# Patient Record
Sex: Female | Born: 1995 | Race: White | Hispanic: No | Smoking: Former smoker
Health system: Southern US, Community
[De-identification: ages and names within clinical notes are randomized; demographics above are authoritative.]

## PROBLEM LIST (undated history)

## (undated) DIAGNOSIS — R350 Frequency of micturition: Secondary | ICD-10-CM

## (undated) HISTORY — DX: Frequency of micturition: R35.0

## (undated) HISTORY — PX: WISDOM TOOTH EXTRACTION: SHX21

---
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
## 2013-01-04 ENCOUNTER — Other Ambulatory Visit: Payer: Self-pay | Admitting: Pediatrics

## 2013-04-02 ENCOUNTER — Other Ambulatory Visit: Payer: Self-pay | Admitting: Pediatrics

## 2013-04-07 ENCOUNTER — Telehealth: Payer: Self-pay | Admitting: *Deleted

## 2013-04-07 NOTE — Telephone Encounter (Signed)
Pt called and stated hat she had contacted pharmacy concerning a refill on her medication and that we had refused it stating that she needed an appointment. After chart review noted pt has not been seen since 12/08/2011 and that she would have to be seen by a MD before any refills could be given. Pt stated that she has already started seeing another MD but can not get in to see them before March and wanted to know if she would still have to be seen before a refill was given. Advised pt that if she has transferred care to another PCP then she would need to fill out a transfer of medical records before we could make her an appointment where then any medications would be discussed with MD.

## 2013-04-12 ENCOUNTER — Ambulatory Visit: Payer: BC Managed Care – PPO | Admitting: Family Medicine

## 2013-04-13 ENCOUNTER — Ambulatory Visit: Payer: BC Managed Care – PPO | Admitting: Family Medicine

## 2013-04-14 ENCOUNTER — Encounter: Payer: Self-pay | Admitting: Family Medicine

## 2013-04-14 ENCOUNTER — Ambulatory Visit (INDEPENDENT_AMBULATORY_CARE_PROVIDER_SITE_OTHER): Payer: BC Managed Care – PPO | Admitting: Family Medicine

## 2013-04-14 VITALS — BP 112/76 | HR 83 | Temp 98.8°F | Resp 16 | Ht 61.5 in | Wt 151.8 lb

## 2013-04-14 DIAGNOSIS — Z789 Other specified health status: Secondary | ICD-10-CM

## 2013-04-14 DIAGNOSIS — R3915 Urgency of urination: Secondary | ICD-10-CM

## 2013-04-14 DIAGNOSIS — Z3009 Encounter for other general counseling and advice on contraception: Secondary | ICD-10-CM

## 2013-04-14 DIAGNOSIS — IMO0001 Reserved for inherently not codable concepts without codable children: Secondary | ICD-10-CM

## 2013-04-14 DIAGNOSIS — F4010 Social phobia, unspecified: Secondary | ICD-10-CM

## 2013-04-14 DIAGNOSIS — R350 Frequency of micturition: Secondary | ICD-10-CM

## 2013-04-14 LAB — POCT URINE PREGNANCY: Preg Test, Ur: NEGATIVE

## 2013-04-14 MED ORDER — SERTRALINE HCL 25 MG PO TABS: 25.0000 mg | ORAL_TABLET | Freq: Every day | ORAL | Status: DC

## 2013-04-14 MED ORDER — NORGESTIM-ETH ESTRAD TRIPHASIC 0.18/0.215/0.25 MG-25 MCG PO TABS: ORAL_TABLET | ORAL | Status: DC

## 2013-04-14 NOTE — Patient Instructions (Signed)
Ethinyl Estradiol; Norgestimate tablets What is this medicine? ETHINYL ESTRADIOL; NORGESTIMATE (ETH in il es tra DYE ole; nor JES ti mate) is an oral contraceptive. The products combine two types of female hormones, an estrogen and a progestin. They are used to prevent ovulation and pregnancy. Some products are also used to treat acne in females. This medicine may be used for other purposes; ask your health care provider or pharmacist if you have questions. COMMON BRAND NAME(S): Estarylla, MONO-LINYAH, MonoNessa, Ortho Tri-Cyclen Lo, Ortho Tri-Cyclen, Ortho-Cyclen, Previfem , Sprintec, Tri-Estarylla, TRI-LINYAH, Tri-Lo-Sprintec , Tri-Previfem , Tri-Sprintec , Trinessa What should I tell my health care provider before I take this medicine? They need to know if you have or ever had any of these conditions: -abnormal vaginal bleeding -blood vessel disease or blood clots -breast, cervical, endometrial, ovarian, liver, or uterine cancer -diabetes -gallbladder disease -heart disease or recent heart attack -high blood pressure -high cholesterol -kidney disease -liver disease -migraine headaches -stroke -systemic lupus erythematosus (SLE) -tobacco smoker -an unusual or allergic reaction to estrogens, progestins, other medicines, foods, dyes, or preservatives -pregnant or trying to get pregnant -breast-feeding How should I use this medicine? Take this medicine by mouth. To reduce nausea, this medicine may be taken with food. Follow the directions on the prescription label. Take this medicine at the same time each day and in the order directed on the package. Do not take your medicine more often than directed. Contact your pediatrician regarding the use of this medicine in children. Special care may be needed. This medicine has been used in female children who have started having menstrual periods. A patient package insert for the product will be given with each prescription and refill. Read this  sheet carefully each time. The sheet may change frequently. Overdosage: If you think you have taken too much of this medicine contact a poison control center or emergency room at once. NOTE: This medicine is only for you. Do not share this medicine with others. What if I miss a dose? If you miss a dose, refer to the patient information sheet you received with your medicine for direction. If you miss more than one pill, this medicine may not be as effective and you may need to use another form of birth control. What may interact with this medicine? -acetaminophen -antibiotics or medicines for infections, especially rifampin, rifabutin, rifapentine, and griseofulvin, and possibly penicillins or tetracyclines -aprepitant -ascorbic acid (vitamin C) -atorvastatin -barbiturate medicines, such as phenobarbital -bosentan -carbamazepine -caffeine -clofibrate -cyclosporine -dantrolene -doxercalciferol -felbamate -grapefruit juice -hydrocortisone -medicines for anxiety or sleeping problems, such as diazepam or temazepam -medicines for diabetes, including pioglitazone -mineral oil -modafinil -mycophenolate -nefazodone -oxcarbazepine -phenytoin -prednisolone -ritonavir or other medicines for HIV infection or AIDS -rosuvastatin -selegiline -soy isoflavones supplements -St. John's wort -tamoxifen or raloxifene -theophylline -thyroid hormones -topiramate -warfarin This list may not describe all possible interactions. Give your health care provider a list of all the medicines, herbs, non-prescription drugs, or dietary supplements you use. Also tell them if you smoke, drink alcohol, or use illegal drugs. Some items may interact with your medicine. What should I watch for while using this medicine? Visit your doctor or health care professional for regular checks on your progress. You will need a regular breast and pelvic exam and Pap smear while on this medicine. You should also discuss the  need for regular mammograms with your health care professional, and follow his or her guidelines for these tests. This medicine can make your body retain fluid, making your   fingers, hands, or ankles swell. Your blood pressure can go up. Contact your doctor or health care professional if you feel you are retaining fluid. Use an additional method of contraception during the first cycle that you take these tablets. If you have any reason to think you are pregnant, stop taking this medicine right away and contact your doctor or health care professional. If you are taking this medicine for hormone related problems, it may take several cycles of use to see improvement in your condition. Smoking increases the risk of getting a blood clot or having a stroke while you are taking birth control pills, especially if you are more than 20 years old. You are strongly advised not to smoke. This medicine can make you more sensitive to the sun. Keep out of the sun. If you cannot avoid being in the sun, wear protective clothing and use sunscreen. Do not use sun lamps or tanning beds/booths. If you wear contact lenses and notice visual changes, or if the lenses begin to feel uncomfortable, consult your eye care specialist. In some women, tenderness, swelling, or minor bleeding of the gums may occur. Notify your dentist if this happens. Brushing and flossing your teeth regularly may help limit this. See your dentist regularly and inform your dentist of the medicines you are taking. If you are going to have elective surgery, you may need to stop taking this medicine before the surgery. Consult your health care professional for advice. This medicine does not protect you against HIV infection (AIDS) or any other sexually transmitted diseases. What side effects may I notice from receiving this medicine? Side effects that you should report to your doctor or health care professional as soon as possible: -breast tissue changes or  discharge -changes in vaginal bleeding during your period or between your periods -chest pain -coughing up blood -dizziness or fainting spells -headaches or migraines -leg, arm or groin pain -severe or sudden headaches -stomach pain (severe) -sudden shortness of breath -sudden loss of coordination, especially on one side of the body -speech problems -symptoms of vaginal infection like itching, irritation or unusual discharge -tenderness in the upper abdomen -vomiting -weakness or numbness in the arms or legs, especially on one side of the body -yellowing of the eyes or skin Side effects that usually do not require medical attention (report to your doctor or health care professional if they continue or are bothersome): -breakthrough bleeding and spotting that continues beyond the 3 initial cycles of pills -breast tenderness -mood changes, anxiety, depression, frustration, anger, or emotional outbursts -increased sensitivity to sun or ultraviolet light -nausea -skin rash, acne, or brown spots on the skin -weight gain (slight) This list may not describe all possible side effects. Call your doctor for medical advice about side effects. You may report side effects to FDA at 1-800-FDA-1088. Where should I keep my medicine? Keep out of the reach of children. Store at room temperature between 15 and 30 degrees C (59 and 86 degrees F). Throw away any unused medicine after the expiration date. NOTE: This sheet is a summary. It may not cover all possible information. If you have questions about this medicine, talk to your doctor, pharmacist, or health care provider.  2014, [employer]vier/Gold Standard. (2008-01-28 13:40:47) Sertraline tablets What is this medicine? SERTRALINE (SER tra leen) is used to treat depression. It may also be used to treat obsessive compulsive disorder, panic disorder, post-trauma stress, premenstrual dysphoric disorder (PMDD) or social anxiety. This medicine may be used  for other purposes;  ask your health care provider or pharmacist if you have questions. COMMON BRAND NAME(S): Zoloft What should I tell my health care provider before I take this medicine? They need to know if you have any of these conditions: -bipolar disorder or a family history of bipolar disorder -diabetes -glaucoma -heart disease -high blood pressure -history of irregular heartbeat -history of low levels of calcium, magnesium, or potassium in the blood -if you often drink alcohol -liver disease -receiving electroconvulsive therapy -seizures -suicidal thoughts, plans, or attempt; a previous suicide attempt by you or a family member -thyroid disease -an unusual or allergic reaction to sertraline, other medicines, foods, dyes, or preservatives -pregnant or trying to get pregnant -breast-feeding How should I use this medicine? Take this medicine by mouth with a glass of water. Follow the directions on the prescription label. You can take it with or without food. Take your medicine at regular intervals. Do not take your medicine more often than directed. Do not stop taking this medicine suddenly except upon the advice of your doctor. Stopping this medicine too quickly may cause serious side effects or your condition may worsen. A special MedGuide will be given to you by the pharmacist with each prescription and refill. Be sure to read this information carefully each time. Talk to your pediatrician regarding the use of this medicine in children. While this drug may be prescribed for children as young as 7 years for selected conditions, precautions do apply. Overdosage: If you think you have taken too much of this medicine contact a poison control center or emergency room at once. NOTE: This medicine is only for you. Do not share this medicine with others. What if I miss a dose? If you miss a dose, take it as soon as you can. If it is almost time for your next dose, take only that dose. Do  not take double or extra doses. What may interact with this medicine? Do not take this medicine with any of the following medications: -certain medicines for fungal infections like fluconazole, itraconazole, ketoconazole, posaconazole, voriconazole -cisapride -disulfiram -dofetilide -linezolid -MAOIs like Carbex, Eldepryl, Marplan, Nardil, and Parnate -metronidazole -methylene blue (injected into a vein) -pimozide -thioridazine -ziprasidone  This medicine may also interact with the following medications: -alcohol -aspirin and aspirin-like medicines -certain medicines for depression, anxiety, or psychotic disturbances -certain medicines for irregular heart beat like flecainide, propafenone -certain medicines for migraine headaches like almotriptan, eletriptan, frovatriptan, naratriptan, rizatriptan, sumatriptan, zolmitriptan -certain medicines for sleep -certain medicines for seizures like carbamazepine, valproic acid, phenytoin -certain medicines that treat or prevent blood clots like warfarin, enoxaparin, dalteparin -cimetidine -digoxin -diuretics -fentanyl -furazolidone -isoniazid -lithium -NSAIDs, medicines for pain and inflammation, like ibuprofen or naproxen -other medicines that prolong the QT interval (cause an abnormal heart rhythm) -procarbazine -rasagiline -supplements like St. John's wort, kava kava, valerian -tolbutamide -tramadol -tryptophan This list may not describe all possible interactions. Give your health care provider a list of all the medicines, herbs, non-prescription drugs, or dietary supplements you use. Also tell them if you smoke, drink alcohol, or use illegal drugs. Some items may interact with your medicine. What should I watch for while using this medicine? Tell your doctor if your symptoms do not get better or if they get worse. Visit your doctor or health care professional for regular checks on your progress. Because it may take several weeks to  see the full effects of this medicine, it is important to continue your treatment as prescribed by your doctor. Patients and their  families should watch out for new or worsening thoughts of suicide or depression. Also watch out for sudden changes in feelings such as feeling anxious, agitated, panicky, irritable, hostile, aggressive, impulsive, severely restless, overly excited and hyperactive, or not being able to sleep. If this happens, especially at the beginning of treatment or after a change in dose, call your health care professional. Dennis Bast may get drowsy or dizzy. Do not drive, use machinery, or do anything that needs mental alertness until you know how this medicine affects you. Do not stand or sit up quickly, especially if you are an older patient. This reduces the risk of dizzy or fainting spells. Alcohol may interfere with the effect of this medicine. Avoid alcoholic drinks. Your mouth may get dry. Chewing sugarless gum or sucking hard candy, and drinking plenty of water may help. Contact your doctor if the problem does not go away or is severe. What side effects may I notice from receiving this medicine? Side effects that you should report to your doctor or health care professional as soon as possible: -allergic reactions like skin rash, itching or hives, swelling of the face, lips, or tongue -black or bloody stools, blood in the urine or vomit -fast, irregular heartbeat -feeling faint or lightheaded, falls -hallucination, loss of contact with reality -seizures -suicidal thoughts or other mood changes -unusual bleeding or bruising -unusually weak or tired -vomiting Side effects that usually do not require medical attention (report to your doctor or health care professional if they continue or are bothersome): -change in appetite -change in sex drive or performance -diarrhea -increased sweating -indigestion, nausea -tremors This list may not describe all possible side effects. Call  your doctor for medical advice about side effects. You may report side effects to FDA at 1-800-FDA-1088. Where should I keep my medicine? Keep out of the reach of children. Store at room temperature between 15 and 30 degrees C (59 and 86 degrees F). Throw away any unused medicine after the expiration date. NOTE: This sheet is a summary. It may not cover all possible information. If you have questions about this medicine, talk to your doctor, pharmacist, or health care provider.  2014, [employer] Standard. (2012-09-08 12:57:35) Social Anxiety Disorder Social anxiety disorder, previously called social phobia, is a mental disorder. People with social anxiety disorder frequently feel nervous, afraid, or embarrassed when around other people in social situations. They constantly worry that other people are judging or criticizing them for how they look, what they say, or how they act. They may worry that other people might reject them because of their appearance or behavior. Social anxiety disorder is more than just occasional shyness or self-consciousness. It can cause severe emotional distress. It can interfere with daily life activities. Social anxiety disorder also may lead to excessive alcohol or drug use and even suicide.  Social anxiety disorder is actually one of the most common mental disorders. It can develop at any time but usually starts in the teenage years. Women are more commonly affected than men. Social anxiety disorder is also more common in people who have family members with anxiety disorders. It also is more common in people who have physical deformities or conditions with characteristics that are obvious to others, such as stuttered speech or movement abnormalities (Parkinson disease).  SYMPTOMS  In addition to feeling anxious or fearful in social situations, people with social anxiety disorder frequently have physical symptoms. Examples include:  Red face (blushing).  Racing  heart.  Sweating.  Shaky hands or  voice.  Confusion.  Lightheadedness.  Upset stomach and diarrhea. DIAGNOSIS  Social anxiety disorder is diagnosed through an assessment by your caregiver. Your caregiver will ask you questions about your mood, thoughts, and reactions in social situations. Your caregiver may ask you about your medical history and use of alcohol or drugs, including prescription medications. Certain medical conditions and the use of certain substances, including caffeine, can cause symptoms similar to social anxiety disorder. Your caregiver may refer you to a mental health specialist for further evaluation or treatment. The criteria for diagnosis of social anxiety disorder are:  Marked fear or anxiety in one or more social situations in which you may be closely watched or studied by others. Examples of such situations include:  Interacting socially (having a conversation with others, going to a party, or meeting strangers).  Being observed (eating or drinking in public or being called on in class).  Performing in front of others (giving a speech).  The social situations of concern almost always cause fear or anxiety, not just occasionally.  People with social anxiety disorder fear that they will be viewed negatively in a way that will be embarrassing, will lead to rejection, or will offend others. This fear is out of proportion to the actual threat posed by the social situation.  Often the triggering social situations are avoided, or they are endured with intense fear or anxiety. The fear, anxiety, or avoidance is persistent and lasts for 6 months or longer.  The anxiety causes difficulty functioning in at least some parts of your daily life. TREATMENT  Several types of treatment are available for social anxiety disorder. These treatments are often used in combination and include:   Talk therapy. Group talk therapy allows you to see that you are not alone with these  problems. Individual talk therapy helps you address your specific anxiety issues with a caring professional. The most effective forms of talk therapy for social anxiety disorder are cognitive behavioral therapy and exposure therapy. Cognitive behavioral therapy helps you to identify and change negative thoughts and beliefs that are at the root of the disorder. Exposure therapy allows you to gradually face the situations that you fear most.  Relaxation and coping techniques. These include deep breathing, self-talk, meditation, visual imagery, and yoga. Relaxation techniques help to keep you calm in social situations.  Social [employer] secretary.Social skills can be learned on your own or with the help of a talk therapist. They can help you feel more confident and comfortable in social situations.  Medication. For anxiety limited to performance situations (performance anxiety), medication called beta blockers can help by reducing or preventing the physical symptoms of social anxiety disorder. For more persistent and generalized social anxiety, antidepressant medication may be prescribed to help control symptoms. In severe cases of social anxiety disorder, strong antianxiety medication, called benzodiazepines, may be prescribed on a limited basis and for a short time. Document Released: 01/10/2005 Document Revised: 06/08/2012 Document Reviewed: 05/12/2012 [facility] Patient Information 2014 [place], [place].

## 2013-04-15 DIAGNOSIS — F4010 Social phobia, unspecified: Secondary | ICD-10-CM | POA: Insufficient documentation

## 2013-04-15 DIAGNOSIS — Z3009 Encounter for other general counseling and advice on contraception: Secondary | ICD-10-CM | POA: Insufficient documentation

## 2013-04-15 DIAGNOSIS — IMO0001 Reserved for inherently not codable concepts without codable children: Secondary | ICD-10-CM | POA: Insufficient documentation

## 2013-04-15 DIAGNOSIS — R3915 Urgency of urination: Secondary | ICD-10-CM | POA: Insufficient documentation

## 2013-04-15 NOTE — Progress Notes (Signed)
Subjective:     Patient ID: Yesenia Macias, female   DOB: 1995-10-21, 18 y.o.   MRN: [id number]  HPI Comments: Yesenia Macias is a 18 y.o WF here with her mother.  Yesenia Macias is here for medication refill. Yesenia Macias has been on Oxybutynin for years. The mother adds to the history today by stating that the patient has overactive bladder and has been given this medicine to control her bladder.  Yesenia Macias has been out of this medicine for some weeks and has been taking Valerian root instead.  Yesenia Macias says Yesenia Macias has read that this can help with the bladder as well. They explain her symptoms and Yesenia Macias states Yesenia Macias can always tell when her bladder will become overactive. Yesenia Macias says it's normally during stressful or anxious situations. Mostly when Yesenia Macias is meeting new people or going places, even familiar places such as to the mall or grocery store. The mother is here because Yesenia Macias had to check Yesenia Macias in because Yesenia Macias was nervous even going up to the check in counter today at the office. Yesenia Macias says it's the 'unknown' that makes her feel nervous. Her hands also may get sweaty and tremor.  Yesenia Macias feels the urge to urinate and most times, Yesenia Macias doesn't really have to go and not much urine will come out.  Yesenia Macias says Yesenia Macias thinks about urinating during stressful situations and the thought of this, will usually allow her to urinate. Yesenia Macias says this has been going on for many years and they both can recall a stressful event that occurred that triggered this and just continued during the years. Yesenia Macias also says Yesenia Macias lives by herself and Yesenia Macias feels anxious when Yesenia Macias goes outside to walk her dog outside.  Yesenia Macias also has overactive bladder during these times as well. The mother also reports a work up in the past when Yesenia Macias was younger and the ultrasound was normal.  Yesenia Macias denies any dysuria, urinary hesitancy, flank pain, vaginal discharge or vaginal pain. Yesenia Macias is sexually active and doesn't use barrier methods all the time. Yesenia Macias use to be on OCPs in the past but haven't been in the last  several months. Her LMP was 2 weeks ago.   PMH: just mentioned in HPI Medications: valerian root Allergies: NKDA Surgeries: None Social: lives by herself and works at [employer]. Yesenia Macias denies any tobacco, alcohol, or illicit drug use.     Review of Systems  Constitutional: Negative for fever, chills, activity change, appetite change and fatigue.  HENT: Negative for congestion and voice change.   Eyes: Negative for photophobia and visual disturbance.  Respiratory: Negative for shortness of breath and wheezing.   Cardiovascular: Negative for chest pain and palpitations.  Gastrointestinal: Negative for nausea, vomiting, abdominal pain, diarrhea and constipation.  Endocrine: Negative for cold intolerance, heat intolerance, polydipsia and polyuria.  Genitourinary: Positive for urgency and frequency. Negative for dysuria, hematuria, flank pain, decreased urine volume, vaginal bleeding, vaginal discharge, enuresis, difficulty urinating, genital sores, vaginal pain, menstrual problem, pelvic pain and dyspareunia.  Musculoskeletal: Negative for back pain.  Skin: Negative for color change and pallor.  Neurological: Negative for dizziness, seizures, weakness, numbness and headaches.  Psychiatric/Behavioral: Negative for suicidal ideas, hallucinations, behavioral problems, confusion, sleep disturbance, self-injury, decreased concentration and agitation. The patient is nervous/anxious. The patient is not hyperactive.        Objective:   Physical Exam  Nursing note and vitals reviewed. Constitutional: Yesenia Macias is oriented to person, place, and time. Yesenia Macias appears well-developed and well-nourished.  HENT:  Head: Normocephalic and atraumatic.  Right Ear: External ear normal.  Left Ear: External ear normal.  Nose: Nose normal.  Eyes: Conjunctivae and EOM are normal.  Neck: Normal range of motion. Neck supple.  Cardiovascular: Normal rate, regular rhythm and normal heart sounds.   Pulmonary/Chest: Effort  normal and breath sounds normal.  Abdominal: Soft. Bowel sounds are normal.  Musculoskeletal: Normal range of motion.  Neurological: Yesenia Macias is alert and oriented to person, place, and time. Yesenia Macias has normal reflexes.  Skin: Skin is warm and dry.  Psychiatric: Yesenia Macias has a normal mood and affect. Her behavior is normal. Judgment and thought content normal.   Urine pregnancy- negative    Assessment:     Yesenia Macias was seen today for medication refill.  Diagnoses and associated orders for this visit:  Urinary urgency  Urinary frequency  Social anxiety disorder - sertraline (ZOLOFT) 25 MG tablet; Take 1 tablet (25 mg total) by mouth daily.  Sexually active - POCT urine pregnancy - Norgestimate-Ethinyl Estradiol Triphasic 0.18/0.215/0.25 MG-25 MCG tab; Take one tab by mouth daily To start the Sunday after your period  Family planning - POCT urine pregnancy - Norgestimate-Ethinyl Estradiol Triphasic 0.18/0.215/0.25 MG-25 MCG tab; Take one tab by mouth daily To start the Sunday after your period       Plan:     Urinary urgency and frequency is always predictable and is likely due to social anxiety/phobia.  Will start a trial of zoloft and see if this helps. Will discontinue the use of oxybutynin as this isn't indicated for this situation and what I suspect the cause of her urinary symptoms is from. Yesenia Macias also can recall a life-changing/stressful event that occurred and triggered these symptoms which coincides more with social anxiety d/o and not overactive bladder. I doubt Yesenia Macias has structural abnormalities of her overactive bladder. They are in agreement when stopping the oxybuytnin and trying the zoloft. I have also counseled her on safe sex practices and due to her sexual activity and no use of barrier methods, Yesenia Macias is wanting OCPs today. Urine pregnancy obtained today and rx given to her for OCPs. Have discussed the possible side effects of both medications and given them handouts on both medicines.    Will see back in 4 weeks for follow up. Have also advised her to take the Zoloft daily in order to get the most benefit from the medicine. Yesenia Macias was also instructed to stop the Valerian root.

## 2013-05-12 ENCOUNTER — Ambulatory Visit (INDEPENDENT_AMBULATORY_CARE_PROVIDER_SITE_OTHER): Payer: BC Managed Care – PPO | Admitting: Family Medicine

## 2013-05-12 ENCOUNTER — Encounter: Payer: Self-pay | Admitting: Family Medicine

## 2013-05-12 VITALS — BP 110/68 | HR 72 | Temp 97.1°F | Resp 18 | Ht 60.25 in | Wt 152.0 lb

## 2013-05-12 DIAGNOSIS — F4010 Social phobia, unspecified: Secondary | ICD-10-CM

## 2013-05-12 DIAGNOSIS — R32 Unspecified urinary incontinence: Secondary | ICD-10-CM

## 2013-05-12 LAB — POCT URINALYSIS DIPSTICK
BILIRUBIN UA: NEGATIVE
GLUCOSE UA: NEGATIVE
KETONES UA: NEGATIVE
Leukocytes, UA: NEGATIVE
NITRITE UA: NEGATIVE
PH UA: 6
Protein, UA: NEGATIVE
RBC UA: NEGATIVE
Urobilinogen, UA: 0.2

## 2013-05-12 MED ORDER — SERTRALINE HCL 50 MG PO TABS: 25.0000 mg | ORAL_TABLET | Freq: Every day | ORAL | Status: DC

## 2013-05-12 NOTE — Patient Instructions (Signed)
Warts - Try Dr. Zoe Lan freeze away kit (blue) Warts are a common viral infection. They are most commonly caused by the human papillomavirus (HPV). Warts can occur at all ages. However, they occur most frequently in older children and infrequently in the elderly. Warts may be single or multiple. Location and size varies. Warts can be spread by scratching the wart and then scratching normal skin. The life cycle of warts varies. However, most will disappear over many months to a couple years. Warts commonly do not cause problems (asymptomatic) unless they are over an area of pressure, such as the bottom of the foot. If they are large enough, they may cause pain with walking. DIAGNOSIS  Warts are most commonly diagnosed by their appearance. Tissue samples (biopsies) are not required unless the wart looks abnormal. Most warts have a rough surface, are round, oval, or irregular, and are skin-colored to light yellow, brown, or gray. They are generally less than  inch (1.3 cm), but they can be any size. TREATMENT   Observation or no treatment.  Freezing with liquid nitrogen.  High heat (cautery).  Boosting the body's immunity to fight off the wart (immunotherapy using Candida antigen).  Laser surgery.  Application of various irritants and solutions. HOME CARE INSTRUCTIONS  Follow your caregiver's instructions. No special precautions are necessary. Often, treatment may be followed by a return (recurrence) of warts. Warts are generally difficult to treat and get rid of. If treatment is done in a clinic setting, usually more than 1 treatment is required. This is usually done on only a monthly basis until the wart is completely gone. SEEK IMMEDIATE MEDICAL CARE IF: The treated skin becomes red, puffy (swollen), or painful. Document Released: 11/21/2004 Document Revised: 06/08/2012 Document Reviewed: 05/19/2009 [facility] Patient Information 2014 [place].

## 2013-05-12 NOTE — Progress Notes (Signed)
   Subjective:    Patient ID: Yesenia Macias, female    DOB: [birth year], 18 y.o.   MRN: [id number]  HPI Pt here with continued incontinance of urine. Prior notes reviewed. She had incontinance as a child, but then it wprsened again within the [past few months. She says it is worse when she has social interactions such as walking into a room of people although she doesn't feel terribly anxious. No dysuria. She does have urgency. No pain.    Review of Systems A 12 point review of systems is negative except as per hpi.       Objective:   Physical Exam Nursing note and vitals reviewed. Constitutional: She is oriented to person, place, and time. She appears well-developed and well-nourished.  HENT:  Cardiovascular: Normal rate, regular rhythm and normal heart sounds.   Pulmonary/Chest: Effort normal and breath sounds normal.  Abdominal: Soft. Bowel sounds are normal. She exhibits no distension. There is mild spt. There is no rebound.  Lymphadenopathy:    She has no cervical adenopathy.  Neurological: She is alert and oriented to person, place, and time. She has normal reflexes.  Skin: Skin is warm and dry.  Psychiatric: She has a normal mood and affect. Her behavior is normal.         Assessment & Plan:  Josiah was seen today for follow-up.  Diagnoses and associated orders for this visit:  Social anxiety disorder - sertraline (ZOLOFT) 50 MG tablet; Take 0.5 tablets (25 mg total) by mouth daily.  Incontinence of urine in female - Ambulatory referral to Urology - hold until ucx results return - POCT urinalysis dipstick - Urine culture

## 2013-05-13 ENCOUNTER — Other Ambulatory Visit: Payer: Self-pay | Admitting: Family Medicine

## 2013-05-15 LAB — URINE CULTURE

## 2013-05-17 ENCOUNTER — Other Ambulatory Visit: Payer: Self-pay | Admitting: Family Medicine

## 2013-05-17 ENCOUNTER — Telehealth: Payer: Self-pay | Admitting: *Deleted

## 2013-05-17 DIAGNOSIS — N390 Urinary tract infection, site not specified: Secondary | ICD-10-CM

## 2013-05-17 MED ORDER — NITROFURANTOIN MONOHYD MACRO 100 MG PO CAPS: 100.0000 mg | ORAL_CAPSULE | Freq: Two times a day (BID) | ORAL | Status: DC

## 2013-05-17 NOTE — Telephone Encounter (Signed)
Message copied by [facility], Louis Meckel on Mon May 17, 2013  1:22 PM ------      Message from: Doran Heater      Created: Mon May 17, 2013  1:08 PM       Please let pt know that her ucx was positive. Im sending her in abx. Hold off on uro referral until after abx - this may resolve sx. If not then go ahead with referral. Thanks AW ------

## 2013-05-17 NOTE — Telephone Encounter (Signed)
Pt called and notified

## 2013-05-17 NOTE — Progress Notes (Unsigned)
macrobid for uti. Please have pt f/u for toc in 2 weeks. Thanks AW

## 2013-05-17 NOTE — Progress Notes (Signed)
Pt notified and appreciative.

## 2013-06-23 ENCOUNTER — Encounter: Payer: Self-pay | Admitting: Family Medicine

## 2013-06-23 ENCOUNTER — Ambulatory Visit (INDEPENDENT_AMBULATORY_CARE_PROVIDER_SITE_OTHER): Payer: BC Managed Care – PPO | Admitting: Family Medicine

## 2013-06-23 VITALS — BP 112/70 | HR 74 | Temp 97.6°F | Resp 18 | Ht 61.0 in | Wt 152.8 lb

## 2013-06-23 DIAGNOSIS — F4010 Social phobia, unspecified: Secondary | ICD-10-CM

## 2013-06-23 MED ORDER — SERTRALINE HCL 100 MG PO TABS: 100.0000 mg | ORAL_TABLET | Freq: Every day | ORAL | Status: DC

## 2013-06-23 NOTE — Patient Instructions (Signed)
Social Anxiety Disorder Social anxiety disorder, previously called social phobia, is a mental disorder. People with social anxiety disorder frequently feel nervous, afraid, or embarrassed when around other people in social situations. They constantly worry that other people are judging or criticizing them for how they look, what they say, or how they act. They may worry that other people might reject them because of their appearance or behavior. Social anxiety disorder is more than just occasional shyness or self-consciousness. It can cause severe emotional distress. It can interfere with daily life activities. Social anxiety disorder also may lead to excessive alcohol or drug use and even suicide.  Social anxiety disorder is actually one of the most common mental disorders. It can develop at any time but usually starts in the teenage years. Women are more commonly affected than men. Social anxiety disorder is also more common in people who have family members with anxiety disorders. It also is more common in people who have physical deformities or conditions with characteristics that are obvious to others, such as stuttered speech or movement abnormalities (Parkinson disease).  SYMPTOMS  In addition to feeling anxious or fearful in social situations, people with social anxiety disorder frequently have physical symptoms. Examples include:  Red face (blushing).  Racing heart.  Sweating.  Shaky hands or voice.  Confusion.  Lightheadedness.  Upset stomach and diarrhea. DIAGNOSIS  Social anxiety disorder is diagnosed through an assessment by your caregiver. Your caregiver will ask you questions about your mood, thoughts, and reactions in social situations. Your caregiver may ask you about your medical history and use of alcohol or drugs, including prescription medications. Certain medical conditions and the use of certain substances, including caffeine, can cause symptoms similar to social anxiety  disorder. Your caregiver may refer you to a mental health specialist for further evaluation or treatment. The criteria for diagnosis of social anxiety disorder are:  Marked fear or anxiety in one or more social situations in which you may be closely watched or studied by others. Examples of such situations include:  Interacting socially (having a conversation with others, going to a party, or meeting strangers).  Being observed (eating or drinking in public or being called on in class).  Performing in front of others (giving a speech).  The social situations of concern almost always cause fear or anxiety, not just occasionally.  People with social anxiety disorder fear that they will be viewed negatively in a way that will be embarrassing, will lead to rejection, or will offend others. This fear is out of proportion to the actual threat posed by the social situation.  Often the triggering social situations are avoided, or they are endured with intense fear or anxiety. The fear, anxiety, or avoidance is persistent and lasts for 6 months or longer.  The anxiety causes difficulty functioning in at least some parts of your daily life. TREATMENT  Several types of treatment are available for social anxiety disorder. These treatments are often used in combination and include:   Talk therapy. Group talk therapy allows you to see that you are not alone with these problems. Individual talk therapy helps you address your specific anxiety issues with a caring professional. The most effective forms of talk therapy for social anxiety disorder are cognitive behavioral therapy and exposure therapy. Cognitive behavioral therapy helps you to identify and change negative thoughts and beliefs that are at the root of the disorder. Exposure therapy allows you to gradually face the situations that you fear most.  Relaxation  and coping techniques. These include deep breathing, self-talk, meditation, visual imagery,  and yoga. Relaxation techniques help to keep you calm in social situations.  Social Company secretary.Social skills can be learned on your own or with the help of a talk therapist. They can help you feel more confident and comfortable in social situations.  Medication. For anxiety limited to performance situations (performance anxiety), medication called beta blockers can help by reducing or preventing the physical symptoms of social anxiety disorder. For more persistent and generalized social anxiety, antidepressant medication may be prescribed to help control symptoms. In severe cases of social anxiety disorder, strong antianxiety medication, called benzodiazepines, may be prescribed on a limited basis and for a short time. Document Released: 01/10/2005 Document Revised: 06/08/2012 Document Reviewed: 05/12/2012 [facility] Patient Information 2014 [place], [place].

## 2013-06-23 NOTE — Progress Notes (Signed)
  Subjective:     Yesenia Macias is a 18 y.o. female who presents for follow up of anxiety disorder. Current symptoms: palpitations, urinary incontinence. She denies current suicidal and homicidal ideation. She complains of the following side effects from the treatment: none. She was on oxybutynin for years before I saw her in February for this urinary incontinence. She says the urinary symptoms still occurred despite being on this medicine and she went and tried Valerian root as well without much relief. She could recall a stressful event that triggered these situations with her urinary symptoms. It only occurs when she is meeting new people. She was started on Zoloft 25mg  daily and followed up in 4 weeks. No relief of this and so she was bumped up to 50 mg daily by Dr. Clydene Laming. She is here today and says it has helped some but she has 2 jobs, and her evening job from [employer], she has some issues with anxiety. She takes her medicine in the morning time. She says she can tell a difference in the medicine and relieving her anxiety but not completely.  The following portions of the patient's history were reviewed and updated as appropriate: allergies, current medications, past family history, past medical history, past social history, past surgical history and problem list.    Past Medical History  Diagnosis Date  . Urinary frequency    Current Outpatient Prescriptions on File Prior to Visit  Medication Sig Dispense Refill  . Norgestimate-Ethinyl Estradiol Triphasic 0.18/0.215/0.25 MG-25 MCG tab Take one tab by mouth daily To start the Sunday after your period  1 Package  2   No current facility-administered medications on file prior to visit.   No Known Allergies  Objective:    BP 112/70  Pulse 74  Temp(Src) 97.6 F (36.4 C) (Temporal)  Resp 18  Ht 5\' 1"  (1.549 m)  Wt 152 lb 12.8 oz (69.31 kg)  BMI 28.89 kg/m2  SpO2 99%  LMP 06/16/2013  General:  alert, cooperative, appears stated age and no  distress  Affect/Behavior:  full facial expressions, good grooming, good insight, normal perception, normal reasoning, normal speech pattern and content and normal thought patterns N/A      Assessment:    Anxiety Disorder - improving    Yesenia Macias was seen today for anxiety.  Diagnoses and associated orders for this visit:  Social anxiety disorder - sertraline (ZOLOFT) 100 MG tablet; Take 1 tablet (100 mg total) by mouth daily. - Ambulatory referral to Psychiatry    Plan:    Medications: Selective Seratonin Reuptake Inhibitor Zoloft. Recommended counseling. Handouts describing disease, natural history, and treatment were given to the patient.  Increase Zoloft from 50 mg to 100 mg daily. Return in 2 weeks for follow up. Refer to Psychiatry for behavioral counseling and interventions to better help her cope with her urinary symptoms.

## 2013-07-05 ENCOUNTER — Ambulatory Visit: Payer: BC Managed Care – PPO | Admitting: Pediatrics

## 2017-12-29 ENCOUNTER — Telehealth: Payer: Self-pay | Admitting: *Deleted

## 2017-12-29 ENCOUNTER — Other Ambulatory Visit: Payer: BLUE CROSS/BLUE SHIELD

## 2017-12-29 ENCOUNTER — Encounter (INDEPENDENT_AMBULATORY_CARE_PROVIDER_SITE_OTHER): Payer: Self-pay

## 2017-12-29 DIAGNOSIS — O209 Hemorrhage in early pregnancy, unspecified: Secondary | ICD-10-CM

## 2017-12-29 NOTE — Telephone Encounter (Signed)
LMOVM that per JAG we need to get HCG.  If patient calls back put on lab schedule.

## 2017-12-29 NOTE — Telephone Encounter (Signed)
Patient called stating she went to her PCP on Friday for vaginal spotting for 2 weeks.  Says it has been light until last night in which then she started having brighter bleeding along with cramps. On Friday, her PCP did not order any labs.  Patient did have intercourse Saturday morning.  Advised no intercourse for at least a week.  Dating U/S scheduled for 01/06/18. Please advise.

## 2017-12-30 ENCOUNTER — Telehealth: Payer: Self-pay | Admitting: Adult Health

## 2017-12-30 ENCOUNTER — Telehealth: Payer: Self-pay | Admitting: *Deleted

## 2017-12-30 LAB — BETA HCG QUANT (REF LAB): HCG QUANT: 559 m[IU]/mL

## 2017-12-30 NOTE — Telephone Encounter (Signed)
Pt aware of quant results and to recheck tomorrow am. Levada Dy put pt on lab schedule for tomorrow am. [facility]

## 2017-12-30 NOTE — Telephone Encounter (Signed)
Left message @ 10:49 am. JSY

## 2017-12-30 NOTE — Telephone Encounter (Signed)
LMOVM that she needs repeat HCG in am per JAG. Advised to call our office to schedule.

## 2017-12-30 NOTE — Telephone Encounter (Signed)
Please call pt in ref to lab results

## 2017-12-31 ENCOUNTER — Other Ambulatory Visit: Payer: BLUE CROSS/BLUE SHIELD

## 2018-01-06 ENCOUNTER — Other Ambulatory Visit: Payer: Self-pay

## 2018-06-10 DIAGNOSIS — D229 Melanocytic nevi, unspecified: Secondary | ICD-10-CM

## 2018-06-10 HISTORY — DX: Melanocytic nevi, unspecified: D22.9

## 2020-02-26 NOTE — L&D Delivery Note (Addendum)
Delivery Note Yesenia Macias is a G2P0010 at 31w0dwho had a spontaneous delivery at 191a viable female ("August") was delivered via  OA.  APGAR: 8, 9; weight 7lb0.7oz .     Admitted for late term IOL. Induced with cytotec, pit, AROM. Progressed normally. Received epidural for pain management. Pushed for 40 minutes. Late decels during pushing responded to resuscitative measures.  Baby was delivered without difficulty. Loose body cord. NICU present at delivery due to thick meconium.  Delayed cord clamping for 60 seconds.  Delivery of placenta was spontaneous. Placenta was found to be intact , 3 -vessel cord was noted. The fundus was found to be firm. Right labial laceration and superficial perineal abrasion were reapprxomated in the normal sterile fashion with 3-0 vicryl. Estimated blood loss 100cc. Instrument and gauze counts were correct at the end of the procedure.   Placenta status: to L&D for disposal  Anesthesia:  epidural Episiotomy:  none Lacerations:  right labial and superficial perineal Suture Repair: 3.0 vicryl Est. Blood Loss (mL):  100  Mom to postpartum.  Baby to Couplet care / Skin to Skin.  MRowland Lathe8/11/2020, 7:00 PM

## 2020-03-03 LAB — OB RESULTS CONSOLE RPR: RPR: NONREACTIVE

## 2020-03-03 LAB — OB RESULTS CONSOLE RUBELLA ANTIBODY, IGM: Rubella: IMMUNE

## 2020-03-03 LAB — OB RESULTS CONSOLE GC/CHLAMYDIA
Chlamydia: NEGATIVE
Gonorrhea: NEGATIVE

## 2020-03-03 LAB — OB RESULTS CONSOLE ANTIBODY SCREEN: Antibody Screen: NEGATIVE

## 2020-03-03 LAB — OB RESULTS CONSOLE ABO/RH: RH Type: POSITIVE

## 2020-03-03 LAB — OB RESULTS CONSOLE HIV ANTIBODY (ROUTINE TESTING): HIV: NONREACTIVE

## 2020-03-03 LAB — OB RESULTS CONSOLE HEPATITIS B SURFACE ANTIGEN: Hepatitis B Surface Ag: NEGATIVE

## 2020-04-10 ENCOUNTER — Other Ambulatory Visit: Payer: Self-pay | Admitting: Obstetrics

## 2020-04-10 DIAGNOSIS — Z363 Encounter for antenatal screening for malformations: Secondary | ICD-10-CM

## 2020-05-03 ENCOUNTER — Other Ambulatory Visit: Payer: Self-pay

## 2020-05-05 ENCOUNTER — Other Ambulatory Visit: Payer: Self-pay | Admitting: Obstetrics

## 2020-05-05 ENCOUNTER — Ambulatory Visit: Payer: BC Managed Care – PPO | Attending: Obstetrics

## 2020-05-05 ENCOUNTER — Other Ambulatory Visit: Payer: Self-pay

## 2020-05-05 DIAGNOSIS — Z363 Encounter for antenatal screening for malformations: Secondary | ICD-10-CM | POA: Diagnosis not present

## 2020-05-05 DIAGNOSIS — O358XX0 Maternal care for other (suspected) fetal abnormality and damage, not applicable or unspecified: Secondary | ICD-10-CM

## 2020-05-05 DIAGNOSIS — O99342 Other mental disorders complicating pregnancy, second trimester: Secondary | ICD-10-CM

## 2020-05-05 DIAGNOSIS — Z3A19 19 weeks gestation of pregnancy: Secondary | ICD-10-CM

## 2020-05-05 DIAGNOSIS — F419 Anxiety disorder, unspecified: Secondary | ICD-10-CM | POA: Diagnosis not present

## 2020-05-05 DIAGNOSIS — O321XX0 Maternal care for breech presentation, not applicable or unspecified: Secondary | ICD-10-CM

## 2020-09-01 LAB — OB RESULTS CONSOLE GBS: GBS: NEGATIVE

## 2020-09-27 ENCOUNTER — Telehealth (HOSPITAL_COMMUNITY): Payer: Self-pay | Admitting: *Deleted

## 2020-09-27 ENCOUNTER — Encounter (HOSPITAL_COMMUNITY): Payer: Self-pay | Admitting: *Deleted

## 2020-09-27 NOTE — Telephone Encounter (Signed)
Preadmission screen  

## 2020-10-02 ENCOUNTER — Other Ambulatory Visit: Payer: Self-pay | Admitting: Obstetrics

## 2020-10-03 ENCOUNTER — Other Ambulatory Visit: Payer: Self-pay | Admitting: Obstetrics and Gynecology

## 2020-10-03 ENCOUNTER — Other Ambulatory Visit: Payer: Self-pay

## 2020-10-03 LAB — SARS CORONAVIRUS 2 (TAT 6-24 HRS): SARS Coronavirus 2: NEGATIVE

## 2020-10-04 ENCOUNTER — Inpatient Hospital Stay (HOSPITAL_COMMUNITY): Payer: BC Managed Care – PPO | Admitting: Anesthesiology

## 2020-10-04 ENCOUNTER — Encounter (HOSPITAL_COMMUNITY): Payer: Self-pay | Admitting: Obstetrics and Gynecology

## 2020-10-04 ENCOUNTER — Inpatient Hospital Stay (HOSPITAL_COMMUNITY)
Admission: AD | Admit: 2020-10-04 | Discharge: 2020-10-06 | DRG: 807 | Disposition: A | Discharge status: Home / Self Care | Payer: BC Managed Care – PPO | Attending: Obstetrics and Gynecology | Admitting: Obstetrics and Gynecology

## 2020-10-04 ENCOUNTER — Inpatient Hospital Stay (HOSPITAL_COMMUNITY): Payer: BC Managed Care – PPO

## 2020-10-04 DIAGNOSIS — Z87891 Personal history of nicotine dependence: Secondary | ICD-10-CM | POA: Diagnosis not present

## 2020-10-04 DIAGNOSIS — Z3A41 41 weeks gestation of pregnancy: Secondary | ICD-10-CM | POA: Diagnosis not present

## 2020-10-04 DIAGNOSIS — O480 Post-term pregnancy: Secondary | ICD-10-CM | POA: Diagnosis present

## 2020-10-04 LAB — RPR: RPR Ser Ql: NONREACTIVE

## 2020-10-04 LAB — CBC
HCT: 35.4 % — ABNORMAL LOW (ref 36.0–46.0)
Hemoglobin: 12.5 g/dL (ref 12.0–15.0)
MCH: 31.3 pg (ref 26.0–34.0)
MCHC: 35.3 g/dL (ref 30.0–36.0)
MCV: 88.7 fL (ref 80.0–100.0)
Platelets: 211 10*3/uL (ref 150–400)
RBC: 3.99 MIL/uL (ref 3.87–5.11)
RDW: 13.1 % (ref 11.5–15.5)
WBC: 13.3 10*3/uL — ABNORMAL HIGH (ref 4.0–10.5)
nRBC: 0 % (ref 0.0–0.2)

## 2020-10-04 LAB — TYPE AND SCREEN
ABO/RH(D): O POS
Antibody Screen: NEGATIVE

## 2020-10-04 MED ORDER — PRENATAL MULTIVITAMIN CH
1.0000 | ORAL_TABLET | Freq: Every day | ORAL | Status: DC
  Administered 2020-10-05 – 2020-10-06 (×2): 1 via ORAL
  Filled 2020-10-04 (×2): qty 1

## 2020-10-04 MED ORDER — OXYTOCIN-SODIUM CHLORIDE 30-0.9 UT/500ML-% IV SOLN
1.0000 m[IU]/min | INTRAVENOUS | Status: DC
  Administered 2020-10-04: 2 m[IU]/min via INTRAVENOUS

## 2020-10-04 MED ORDER — LACTATED RINGERS IV SOLN: 500.0000 mL | INTRAVENOUS | Status: DC | PRN

## 2020-10-04 MED ORDER — COCONUT OIL OIL: 1.0000 "application " | TOPICAL_OIL | Status: DC | PRN

## 2020-10-04 MED ORDER — SIMETHICONE 80 MG PO CHEW: 80.0000 mg | CHEWABLE_TABLET | ORAL | Status: DC | PRN

## 2020-10-04 MED ORDER — EPHEDRINE 5 MG/ML INJ: 10.0000 mg | INTRAVENOUS | Status: DC | PRN

## 2020-10-04 MED ORDER — TERBUTALINE SULFATE 1 MG/ML IJ SOLN
0.2500 mg | Freq: Once | INTRAMUSCULAR | Status: AC | PRN
  Administered 2020-10-04: 0.25 mg via SUBCUTANEOUS
  Filled 2020-10-04: qty 1

## 2020-10-04 MED ORDER — SOD CITRATE-CITRIC ACID 500-334 MG/5ML PO SOLN
30.0000 mL | ORAL | Status: DC | PRN
  Filled 2020-10-04: qty 30

## 2020-10-04 MED ORDER — BISACODYL 10 MG RE SUPP: 10.0000 mg | Freq: Every day | RECTAL | Status: DC | PRN

## 2020-10-04 MED ORDER — ONDANSETRON HCL 4 MG/2ML IJ SOLN: 4.0000 mg | INTRAMUSCULAR | Status: DC | PRN

## 2020-10-04 MED ORDER — IBUPROFEN 600 MG PO TABS
600.0000 mg | ORAL_TABLET | Freq: Four times a day (QID) | ORAL | Status: DC
  Administered 2020-10-05 – 2020-10-06 (×7): 600 mg via ORAL
  Filled 2020-10-04 (×7): qty 1

## 2020-10-04 MED ORDER — OXYCODONE HCL 5 MG PO TABS: 5.0000 mg | ORAL_TABLET | ORAL | Status: DC | PRN

## 2020-10-04 MED ORDER — FLEET ENEMA 7-19 GM/118ML RE ENEM: 1.0000 | ENEMA | RECTAL | Status: DC | PRN

## 2020-10-04 MED ORDER — TETANUS-DIPHTH-ACELL PERTUSSIS 5-2.5-18.5 LF-MCG/0.5 IM SUSY: 0.5000 mL | PREFILLED_SYRINGE | Freq: Once | INTRAMUSCULAR | Status: DC

## 2020-10-04 MED ORDER — PHENYLEPHRINE 40 MCG/ML (10ML) SYRINGE FOR IV PUSH (FOR BLOOD PRESSURE SUPPORT): 80.0000 ug | PREFILLED_SYRINGE | INTRAVENOUS | Status: DC | PRN

## 2020-10-04 MED ORDER — DOCUSATE SODIUM 100 MG PO CAPS
100.0000 mg | ORAL_CAPSULE | Freq: Two times a day (BID) | ORAL | Status: DC
  Administered 2020-10-05 – 2020-10-06 (×3): 100 mg via ORAL
  Filled 2020-10-04 (×3): qty 1

## 2020-10-04 MED ORDER — ACETAMINOPHEN 325 MG PO TABS: 650.0000 mg | ORAL_TABLET | ORAL | Status: DC | PRN

## 2020-10-04 MED ORDER — LACTATED RINGERS IV SOLN: 500.0000 mL | Freq: Once | INTRAVENOUS | Status: DC

## 2020-10-04 MED ORDER — BENZOCAINE-MENTHOL 20-0.5 % EX AERO
1.0000 "application " | INHALATION_SPRAY | CUTANEOUS | Status: DC | PRN
  Administered 2020-10-05: 1 via TOPICAL
  Filled 2020-10-04: qty 56

## 2020-10-04 MED ORDER — OXYTOCIN-SODIUM CHLORIDE 30-0.9 UT/500ML-% IV SOLN
2.5000 [IU]/h | INTRAVENOUS | Status: DC
  Administered 2020-10-04: 2.5 [IU]/h via INTRAVENOUS
  Filled 2020-10-04: qty 500

## 2020-10-04 MED ORDER — OXYCODONE-ACETAMINOPHEN 5-325 MG PO TABS: 2.0000 | ORAL_TABLET | ORAL | Status: DC | PRN

## 2020-10-04 MED ORDER — ONDANSETRON HCL 4 MG/2ML IJ SOLN: 4.0000 mg | Freq: Four times a day (QID) | INTRAMUSCULAR | Status: DC | PRN

## 2020-10-04 MED ORDER — TERBUTALINE SULFATE 1 MG/ML IJ SOLN
0.2500 mg | Freq: Once | INTRAMUSCULAR | Status: DC | PRN
  Filled 2020-10-04: qty 1

## 2020-10-04 MED ORDER — MISOPROSTOL 25 MCG QUARTER TABLET
25.0000 ug | ORAL_TABLET | ORAL | Status: DC | PRN
  Administered 2020-10-04: 25 ug via VAGINAL
  Filled 2020-10-04: qty 1

## 2020-10-04 MED ORDER — LIDOCAINE HCL (PF) 1 % IJ SOLN
INTRAMUSCULAR | Status: DC | PRN
  Administered 2020-10-04 (×2): 4 mL via EPIDURAL

## 2020-10-04 MED ORDER — DIPHENHYDRAMINE HCL 25 MG PO CAPS: 25.0000 mg | ORAL_CAPSULE | Freq: Four times a day (QID) | ORAL | Status: DC | PRN

## 2020-10-04 MED ORDER — OXYTOCIN BOLUS FROM INFUSION
333.0000 mL | Freq: Once | INTRAVENOUS | Status: AC
  Administered 2020-10-04: 333 mL via INTRAVENOUS

## 2020-10-04 MED ORDER — ACETAMINOPHEN 325 MG PO TABS
650.0000 mg | ORAL_TABLET | ORAL | Status: DC | PRN
  Filled 2020-10-04: qty 2

## 2020-10-04 MED ORDER — OXYCODONE HCL 5 MG PO TABS: 10.0000 mg | ORAL_TABLET | ORAL | Status: DC | PRN

## 2020-10-04 MED ORDER — ONDANSETRON HCL 4 MG PO TABS: 4.0000 mg | ORAL_TABLET | ORAL | Status: DC | PRN

## 2020-10-04 MED ORDER — LACTATED RINGERS IV SOLN: INTRAVENOUS | Status: DC

## 2020-10-04 MED ORDER — LIDOCAINE HCL (PF) 1 % IJ SOLN: 30.0000 mL | INTRAMUSCULAR | Status: DC | PRN

## 2020-10-04 MED ORDER — DIPHENHYDRAMINE HCL 50 MG/ML IJ SOLN: 12.5000 mg | INTRAMUSCULAR | Status: DC | PRN

## 2020-10-04 MED ORDER — OXYTOCIN-SODIUM CHLORIDE 30-0.9 UT/500ML-% IV SOLN
1.0000 m[IU]/min | INTRAVENOUS | Status: DC
  Administered 2020-10-04: 1 m[IU]/min via INTRAVENOUS

## 2020-10-04 MED ORDER — DIBUCAINE (PERIANAL) 1 % EX OINT: 1.0000 "application " | TOPICAL_OINTMENT | CUTANEOUS | Status: DC | PRN

## 2020-10-04 MED ORDER — FENTANYL-BUPIVACAINE-NACL 0.5-0.125-0.9 MG/250ML-% EP SOLN
12.0000 mL/h | EPIDURAL | Status: DC | PRN
  Administered 2020-10-04: 10.5 mL/h via EPIDURAL
  Filled 2020-10-04: qty 250

## 2020-10-04 MED ORDER — FLEET ENEMA 7-19 GM/118ML RE ENEM: 1.0000 | ENEMA | Freq: Every day | RECTAL | Status: DC | PRN

## 2020-10-04 MED ORDER — OXYCODONE-ACETAMINOPHEN 5-325 MG PO TABS: 1.0000 | ORAL_TABLET | ORAL | Status: DC | PRN

## 2020-10-04 MED ORDER — WITCH HAZEL-GLYCERIN EX PADS: 1.0000 "application " | MEDICATED_PAD | CUTANEOUS | Status: DC | PRN

## 2020-10-04 NOTE — Lactation Note (Signed)
This note was copied from a baby's chart. Lactation Consultation Note  Patient Name: Yesenia Macias [id number] Date: 10/04/2020 Reason for consult: L&D Initial assessment;1st time breastfeeding;Term (Per mom, she watched breastfeeding videos online in pregnancy.) Age:27 hours LC entered room in L&D, mom was doing skin to skin with infant and infant was expressing feeding cues. Mom attempted to latch infant on her right breast using the football hold, mom has flat nipples and LC ask mom to do little hand expression prior to latching infant to help evert nipple shaft out more. Infant was off and on breast for 7 minutes did not sustain latch, LC observed when infant was crying his frenulum is short and infant did not extend tongue past gumline.  LC discussed Infant's input and output with parents. Mom could benefit from hand pump to pre-pump breast prior to latching infant due to having  flat nipples. LC informed MBU -RN to give mom hand pump and breast shells and explain how to use on MBU. Mom's plan:  1- Mom will breastfeed infant according to primal feeding cues: licking , tasting, smacking, hands and fist in mouth and breastfeed infant skin to skin. 2- Mom will pre-pump breast prior to latching infant at the breast and ask for latch assistance from RN and Tice. 3- Mom knows to hand express and give infant back colostrum by spoon if infant doesn't latch at the breast and continue to do lots of skin to skin.    Maternal Data Has patient been taught Hand Expression?: Yes Does the patient have breastfeeding experience prior to this delivery?: No  Feeding Mother's Current Feeding Choice: Breast Milk  LATCH Score Latch: Repeated attempts needed to sustain latch, nipple held in mouth throughout feeding, stimulation needed to elicit sucking reflex.  Audible Swallowing: A few with stimulation  Type of Nipple: Flat  Comfort (Breast/Nipple): Soft / non-tender  Hold (Positioning): Assistance  needed to correctly position infant at breast and maintain latch.  LATCH Score: 6   Lactation Tools Discussed/Used    Interventions Interventions: Breast feeding basics reviewed;Skin to skin;Hand express;Pre-pump if needed;Breast compression;Adjust position;Support pillows;Position options;Expressed milk;Education  Discharge Pump: Personal [employer] Program: No  Consult Status Consult Status: Follow-up Date: 10/05/20 Follow-up type: In-patient    Vicente Serene 10/04/2020, 7:08 PM

## 2020-10-04 NOTE — Anesthesia Preprocedure Evaluation (Signed)
Anesthesia Evaluation  Patient identified by MRN, date of birth, ID band Patient awake    Reviewed: Allergy & Precautions, Patient's Chart, lab work & pertinent test results  Airway Mallampati: II  TM Distance: >3 FB Neck ROM: Full    Dental no notable dental hx. (+) Teeth Intact, Dental Advisory Given   Pulmonary former smoker,    Pulmonary exam normal breath sounds clear to auscultation       Cardiovascular negative cardio ROS Normal cardiovascular exam Rhythm:Regular Rate:Normal     Neuro/Psych PSYCHIATRIC DISORDERS Anxiety negative neurological ROS     GI/Hepatic Neg liver ROS, GERD  ,  Endo/Other  Obesity  Renal/GU negative Renal ROS  negative genitourinary   Musculoskeletal negative musculoskeletal ROS (+)   Abdominal (+) + obese,   Peds  Hematology negative hematology ROS (+)   Anesthesia Other Findings   Reproductive/Obstetrics (+) Pregnancy                             Anesthesia Physical Anesthesia Plan  ASA: 2  Anesthesia Plan: Epidural   Post-op Pain Management:    Induction:   PONV Risk Score and Plan:   Airway Management Planned: Natural Airway  Additional Equipment:   Intra-op Plan:   Post-operative Plan:   Informed Consent: I have reviewed the patients History and Physical, chart, labs and discussed the procedure including the risks, benefits and alternatives for the proposed anesthesia with the patient or authorized representative who has indicated his/her understanding and acceptance.       Plan Discussed with: Anesthesiologist  Anesthesia Plan Comments:         Anesthesia Quick Evaluation

## 2020-10-04 NOTE — Lactation Note (Signed)
This note was copied from a baby's chart. Lactation Consultation Note  Patient Name: Yesenia Macias [id number] Date: 10/04/2020 Reason for consult: Initial assessment;Difficult latch;Term;Other (Comment) (tongue thrusting nipple from mouth after latching) Age:27 hours  RN reports to [facility] infant is unable to sustain latch.  Mom's breast compressible and infant latches using a teacup hold but then tongue thrusts the nipple out and sucks tongue or nipple.   Mom and dad hand express into spoon.  Laid back and cross cradle attempted. Upon oral assessment infant thrusts gloved finger.  LC taught suck training to parents.  Infant latched in laid back for 4 minutes and nipple was crimped and a ridge was noted along the center.  Mom denied pain.  Lingual frenulum noted when infant cries.  LC finger fed 3m EBM to infant.    Plan: Hand express prior to latching, feed August with cues.  STS often.  Feed back any EBM to infant.  Call out if infant is unable to latch or continuously slips after latching.    Brochure provided.  CFlora  Recommended follow up with Beth S. For lactation support.  BGSG, phone line, and cone OP LC info all provided.  Maternal Data Has patient been taught Hand Expression?: Yes  Feeding Mother's Current Feeding Choice: Breast Milk  LATCH Score Latch: Repeated attempts needed to sustain latch, nipple held in mouth throughout feeding, stimulation needed to elicit sucking reflex.  Audible Swallowing: A few with stimulation  Type of Nipple: Flat  Comfort (Breast/Nipple): Soft / non-tender  Hold (Positioning): Assistance needed to correctly position infant at breast and maintain latch.  LATCH Score: 6   Lactation Tools Discussed/Used Tools: Pump Breast pump type: Manual Reason for Pumping: pre pump prior to latching to help infant grasp breast  Interventions Interventions: Breast feeding basics reviewed;Breast compression;Adjust position;Assisted with  latch;Skin to skin;Support pillows;Position options;Breast massage;Hand express;Expressed milk;Pre-pump if needed;Education  Discharge Pump: Personal (Medela)  Consult Status Consult Status: Follow-up Date: 10/05/20 Follow-up type: In-patient    KFerne CoeBDakota Plains Surgical Center8/11/2020, 11:31 PM

## 2020-10-04 NOTE — Progress Notes (Signed)
OB Progress Note  Pitocin turned off due to recurrent late decels.  Continue IV fluids, position change PRN. Continues to have moderate variability, + accels. Continues to contract q 2-3 mins.   Will leave pit off for 30 mins. If contractions have spaced, will restart.   Discussed with patient and her husband. If evidence of fetal distress that does not respond to resuscitative measures or fetal intolerance to labor, would proceed with cesarean.   Luther Redo, MD 10/04/20 11:40 AM

## 2020-10-04 NOTE — Progress Notes (Signed)
OB Progress Note  S:Patient comfortable s/p epidural   O: Today's Vitals   10/04/20 0936 10/04/20 0945 10/04/20 0946 10/04/20 1032  BP: 124/83  120/67 112/85  Pulse: 60  64 81  Resp: 18   18  Temp:      TempSrc:      SpO2:  97%    Weight:      Height:      PainSc:       Body mass index is 35.82 kg/m.  SVE 5/80/-1, AROM thick mec  FHR: 135bpm, mod variability, + accels, late decels resolved with position change. + scalp stim with exam Toco: ctx q 2-4 mins   A/P: 25Y G2P0010 @ [redacted]w[redacted]d IOL late term Fetal wellbeing: cat I-2 tracing, decels resolved with position change IOL: s/p cytotec, s/p AROM, continue pitocin Pain control: epidural    MRowland Lathe8/11/2020, 9:10 AM

## 2020-10-04 NOTE — Anesthesia Procedure Notes (Signed)
Epidural Patient location during procedure: OB Start time: 10/04/2020 9:17 AM End time: 10/04/2020 9:24 AM  Staffing Anesthesiologist: Josephine Igo, MD Performed: anesthesiologist   Preanesthetic Checklist Completed: patient identified, IV checked, site marked, risks and benefits discussed, surgical consent, monitors and equipment checked, pre-op evaluation and timeout performed  Epidural Patient position: sitting Prep: DuraPrep and site prepped and draped Patient monitoring: continuous pulse ox and blood pressure Approach: midline Location: L3-L4 Injection technique: LOR air  Needle:  Needle type: Tuohy  Needle gauge: 17 G Needle length: 9 cm and 9 Needle insertion depth: 5 cm Catheter type: closed end flexible Catheter size: 19 Gauge Catheter at skin depth: 10 cm Test dose: negative and Other  Assessment Events: blood not aspirated, injection not painful, no injection resistance, no paresthesia and negative IV test  Additional Notes Patient identified. Risks and benefits discussed including failed block, incomplete  Pain control, post dural puncture headache, nerve damage, paralysis, blood pressure Changes, nausea, vomiting, reactions to medications-both toxic and allergic and post Partum back pain. All questions were answered. Patient expressed understanding and wished to proceed. Sterile technique was used throughout procedure. Epidural site was Dressed with sterile barrier dressing. No paresthesias, signs of intravascular injection Or signs of intrathecal spread were encountered.  Patient was more comfortable after the epidural was dosed. Please see RN's note for documentation of vital signs and FHR which are stable. Reason for block:procedure for pain

## 2020-10-04 NOTE — H&P (Signed)
Yesenia Macias is a 25 y.o. female G72P0010 61w0dpresenting for IOL for late term. Overnight, she received cytotec x 1. Around 0400, had deep variable decels and terbutaline was given. Tracing recovered  and she was started on pitocin this AM around 0530. She reports contractions and good FM. No LOF, VB.   Pregnancy uncomplicated.    OB History     Gravida  2   Para      Term      Preterm      AB  1   Living         SAB  1   IAB      Ectopic      Multiple      Live Births             Past Medical History:  Diagnosis Date   Urinary frequency    Past Surgical History:  Procedure Laterality Date   WISDOM TOOTH EXTRACTION     Family History: family history is not on file. Social History:  reports that she has quit smoking. She has never used smokeless tobacco. No history on file for alcohol use and drug use.     Maternal Diabetes: No Genetic Screening: Normal Maternal Ultrasounds/Referrals: Isolated EIF (echogenic intracardiac focus) Fetal Ultrasounds or other Referrals:  None Maternal Substance Abuse:  No Significant Maternal Medications:  None Significant Maternal Lab Results:  Group B Strep negative Other Comments:  None  Review of Systems Per HPI Exam Physical Exam  Dilation: 4 Effacement (%): 80 Station: -2 Exam by:: E. Rothermel RN Blood pressure 113/65, pulse (!) 57, temperature 97.9 F (36.6 C), temperature source Oral, resp. rate 18, height '5\' 1"'$  (1.549 m), weight 86 kg, SpO2 96 %. Gen: NAD, ambulating in room CVS: RRR Lungs: CTAB Abd: Gravid abdomen, Leopolds 7.5# Ext: no calf edema or tenderness SVE: 4/80/-2 by RN Fetal testing: FHR 140bpm, mod variability, + accels, no decels Toco: ctx q 2-4 mins Prenatal labs: ABO, Rh:  --/--/O POS (08/10 [id number] Antibody: NEG (08/10 0051) Rubella: Immune (01/07 0000) RPR: Nonreactive (01/07 0000)  HBsAg: Negative (01/07 0000)  HIV: Non-reactive (01/07 0000)  GBS: Negative/-- (07/08 0000)    Assessment/Plan: 25Y G2P0010 @ 443w0dIOL late term Fetal wellbeing: cat I tracing IOL: s/p cytotec, continue pitocin. Plan for AROM when fetal head well engaged Pain control: epidural upon patient request.   MiRowland Lathe/11/2020, 9:10 AM

## 2020-10-05 LAB — CBC
HCT: 35.2 % — ABNORMAL LOW (ref 36.0–46.0)
Hemoglobin: 11.9 g/dL — ABNORMAL LOW (ref 12.0–15.0)
MCH: 30.5 pg (ref 26.0–34.0)
MCHC: 33.8 g/dL (ref 30.0–36.0)
MCV: 90.3 fL (ref 80.0–100.0)
Platelets: 198 10*3/uL (ref 150–400)
RBC: 3.9 MIL/uL (ref 3.87–5.11)
RDW: 13.2 % (ref 11.5–15.5)
WBC: 15.8 10*3/uL — ABNORMAL HIGH (ref 4.0–10.5)
nRBC: 0 % (ref 0.0–0.2)

## 2020-10-05 NOTE — Lactation Note (Addendum)
This note was copied from a baby's chart. Lactation Consultation Note  Patient Name: Yesenia Macias [id number] Date: 10/05/2020 Reason for consult: Follow-up assessment;Mother's request;Difficult latch;1st time breastfeeding;Term (Per mom, infant has been sleepy, not sustaining latch, mom has done lots of skin to skin and hand expression, giving infant back colostrum by spoon.) Age:27 hours LC entered room, infant fussy due to having lab blood work done, LC did suck training to calm infant down, and work on flanging out infant's top and bottom lip. Mom has flat nipples and infant with lingual frenulum.  LC ask mom to pre-pump breast with hand pump and applying 20 mm NS that was pre-filled with 0.5 mls of donor breast milk. Mom latched infant on her left breast using the cross cradle hold, infant sustained latch and breastfeed for 20 minutes, afterwards colostrum was present in NS. Per mom, she felt no pain or discomfort with latch and LC observed mom's nipple was rounded. Afterwards infant was given 6.5 mls of donor breast milk on LC's gloved finger with curve tip syringe, after being burped infant immediately fell asleep. Mom's feeding plan: 1- Mom will continue to breastfeed infant according to cues, 8 to 12+ times or more , skin to skin. 2- Mom will pre-pump breast with hand pump and apply 20 mm NS prior to latching infant at the breast, mom will ask for latch assistance if needed by RN or LC. 3- If infant continue to sustain latch and milk transfer is observe mom will stop donor breast milk. 4- Mom will continue to use DEBP every 3 hours for 15 minutes on initial setting and after latching infant at the breast, mom will give infant back any EBM that is pumped by spoon or curve tip syringe.  Parents understand that infant feeding plan may change based on infant feeding behaviors.  Maternal Data    Feeding Mother's Current Feeding Choice: Breast Milk and Donor Milk  LATCH Score Latch:  Grasps breast easily, tongue down, lips flanged, rhythmical sucking. (Infant sustained latch with 20 mm NS.)  Audible Swallowing: Spontaneous and intermittent  Type of Nipple: Flat  Comfort (Breast/Nipple): Soft / non-tender  Hold (Positioning): Assistance needed to correctly position infant at breast and maintain latch.  LATCH Score: 8   Lactation Tools Discussed/Used Breast pump type: Double-Electric Breast Pump Pump Education: Setup, frequency, and cleaning;Milk Storage Reason for Pumping: Mom started pumping due infant being poor feeder not sustaining latch, sleeping at breast. Pumping frequency: Mom will start pumping every 3 hours for 15 minutes on inital setting.  Per mom, she has pumped once today.  Interventions Interventions: Assisted with latch;Skin to skin;Pre-pump if needed;Breast compression;Adjust position;Support pillows;Position options;Expressed milk;DEBP;Education;Shells;Hand pump  Discharge    Consult Status Consult Status: Follow-up Date: 10/06/20 Follow-up type: In-patient    Vicente Serene 10/05/2020, 7:35 PM

## 2020-10-05 NOTE — Anesthesia Postprocedure Evaluation (Signed)
Anesthesia Post Note  Patient: Yesenia Macias  Procedure(s) Performed: AN AD HOC LABOR EPIDURAL     Patient location during evaluation: Mother Baby Anesthesia Type: Epidural Level of consciousness: awake and alert Pain management: pain level controlled Vital Signs Assessment: post-procedure vital signs reviewed and stable Respiratory status: spontaneous breathing, nonlabored ventilation and respiratory function stable Cardiovascular status: stable Postop Assessment: no headache, no backache and epidural receding Anesthetic complications: no   No notable events documented.  Last Vitals:  Vitals:   10/05/20 0021 10/05/20 0434  BP: 135/77 126/84  Pulse: 71 66  Resp: 18 18  Temp: 36.9 C 36.6 C  SpO2: 100% 96%    Last Pain:  Vitals:   10/05/20 0434  TempSrc: Oral  PainSc: 0-No pain   Pain Goal:                   [employer]

## 2020-10-05 NOTE — Progress Notes (Signed)
Post Partum Day 1 Subjective: no complaints, up ad lib, voiding, and tolerating PO  Objective: Vitals:   10/04/20 2015 10/04/20 2116 10/05/20 0021 10/05/20 0434  BP: 133/80 132/80 135/77 126/84  Pulse: 62 60 71 66  Resp: '16 16 18 18  '$ Temp: 98.7 F (37.1 C) 99 F (37.2 C) 98.5 F (36.9 C) 97.8 F (36.6 C)  TempSrc: Oral Oral Oral Oral  SpO2: 98% 100% 100% 96%  Weight:      Height:          Physical Exam:  General: alert Lochia: appropriate Uterine Fundus: firm DVT Evaluation: No evidence of DVT seen on physical exam.  Recent Labs    10/04/20 0030 10/05/20 0540  WBC 13.3* 15.8*  HGB 12.5 11.9*  HCT 35.4* 35.2*  PLT 211 198   Assessment/Plan: Plan for discharge tomorrow  AVIV SALZ 25 y.o. G2P1011 PPD#1 sp TSVD 1. PPC: routine 2. Anxiety: reports previously on meds, none currently. Reviewed s/sx of postpartum depression, worsening anxiety Rubella Immune, blood type O POS, breastfeeding, baby boy in room, birth control declines. Vaccines: tdap prenatally. Declines COVID series    LOS: 1 day   Shaune Malacara K Taam-Akelman 10/05/2020, 9:11 AM

## 2020-10-05 NOTE — Social Work (Signed)
MOB was referred for history of anxiety.  * Referral screened out by Clinical Social Worker because none of the following criteria appear to apply: ~ History of anxiety during this pregnancy, or of post-partum depression following prior delivery. ~ Diagnosis of anxiety and/or depression within last 3 years. Per chart review, it is noted that  MOB was diagnosed with anxiety in 2015.   OR * MOB's symptoms currently being treated with medication and/or therapy.  Please contact the Clinical Social Worker if needs arise, by [facility] request, or if MOB scores greater than 9/yes to question 10 on Edinburgh Postpartum Depression Screen.   Kathrin Greathouse, MSW, LCSW Women's and [place] Worker  [phone] 10/05/2020  9:25 AM

## 2020-10-06 NOTE — Discharge Summary (Signed)
Postpartum Discharge Summary  Date of Service updated      Patient Name: Yesenia Macias DOB: 08/03/1995 MRN: [id number]  Date of admission: 10/04/2020 Delivery date:10/04/2020  Delivering provider: Irene Pap E  Date of discharge: 10/06/2020  Admitting diagnosis: Post term pregnancy at [redacted] weeks gestation [O48.0, Z3A.41] Intrauterine pregnancy: [redacted]w[redacted]d    Secondary diagnosis:  Active Problems:   Post term pregnancy at 493weeks gestation  Additional problems: late term    Discharge diagnosis: Term Pregnancy Delivered                                              Post partum procedures: none Augmentation: AROM, Pitocin, and Cytotec Complications: None  Hospital course: Induction of Labor With Vaginal Delivery   25y.o. yo G2P1011 at 434w0das admitted to the hospital 10/04/2020 for induction of labor.  Indication for induction: Postdates.  Patient had an uncomplicated labor course as follows: Membrane Rupture Time/Date: 10:35 AM ,10/04/2020   Delivery Method:Vaginal, Spontaneous  Episiotomy: None  Lacerations:  Labial  Details of delivery can be found in separate delivery note.  Patient had a routine postpartum course. Patient is discharged home 10/06/20.  Newborn Data: Birth date:10/04/2020  Birth time:5:51 PM  Gender:Female  Living status:Living  Apgars:8 ,9  Weight:3195 g   Magnesium Sulfate received: No BMZ received: No Rhophylac:No MMR:No T-DaP:Given prenatally Flu: No Transfusion:No  Physical exam  Vitals:   10/05/20 0926 10/05/20 1319 10/05/20 2056 10/06/20 0557  BP: 136/88 117/76 133/82 111/80  Pulse: 60 69 77 68  Resp: _0 Temp:  98.4 F (36.9 C) 98.4 F (36.9 C) 98.6 F (37 C)  TempSrc:  Oral Oral Oral  SpO2: 99% 98% 99% 98%  Weight:      Height:       Labs: Lab Results  Component Value Date   WBC 15.8 (H) 10/05/2020   HGB 11.9 (L) 10/05/2020   HCT 35.2 (L) 10/05/2020   MCV 90.3 10/05/2020   PLT 198 10/05/2020   No  flowsheet data found. Edinburgh Score: Edinburgh Postnatal Depression Scale Screening Tool 10/05/2020  I have been able to laugh and see the funny side of things. 0  I have looked forward with enjoyment to things. 0  I have blamed myself unnecessarily when things went wrong. 1  I have been anxious or worried for no good reason. 2  I have felt scared or panicky for no good reason. 1  Things have been getting on top of me. 0  I have been so unhappy that I have had difficulty sleeping. 0  I have felt sad or miserable. 0  I have been so unhappy that I have been crying. 0  The thought of harming myself has occurred to me. 0  Edinburgh Postnatal Depression Scale Total 4      After visit meds:  Allergies as of 10/06/2020   No Known Allergies      Medication List     STOP taking these medications    Norgestimate-Ethinyl Estradiol Triphasic 0.18/0.215/0.25 MG-25 MCG tab   sertraline 100 MG tablet Commonly known as: ZOLOFT       TAKE these medications    polyethylene glycol 17 g packet Commonly known as: MIRALAX / GLYCOLAX Take 17 g by mouth daily as needed for mild constipation.   PRENATAL PO  Take 1 tablet by mouth daily.   PROBIOTIC PO Take 1 tablet by mouth daily.               Discharge Care Instructions  (From admission, onward)           Start     Ordered   10/06/20 0000  Discharge wound care:       Comments: Sitz baths and icepacks to perineum.  If stitches, they will dissolve.   10/06/20 0759             Discharge home in stable condition Infant Feeding:  ? Infant Disposition:home with mother Discharge instruction: per After Visit Summary and Postpartum booklet. Activity: Advance as tolerated. Pelvic rest for 6 weeks.  Diet: routine diet Anticipated Birth Control: Unsure Postpartum Appointment:4 weeks Additional Postpartum F/U:  none Future Appointments:No future appointments. Follow up Visit:  Follow-up Information     Rowland Lathe, MD Follow up in 4 week(s).   Specialty: Obstetrics and Gynecology Contact information: [address] [place] [address] [phone]                     10/06/2020 Daria Pastures, MD

## 2020-10-06 NOTE — Progress Notes (Signed)
Patient is eating, ambulating, voiding.  Pain control is good.  Vitals:   10/05/20 0926 10/05/20 1319 10/05/20 2056 10/06/20 0557  BP: 136/88 117/76 133/82 111/80  Pulse: 60 69 77 68  Resp: '18 14 16 18  '$ Temp:  98.4 F (36.9 C) 98.4 F (36.9 C) 98.6 F (37 C)  TempSrc:  Oral Oral Oral  SpO2: 99% 98% 99% 98%  Weight:      Height:        Fundus firm Perineum without swelling.  Lab Results  Component Value Date   WBC 15.8 (H) 10/05/2020   HGB 11.9 (L) 10/05/2020   HCT 35.2 (L) 10/05/2020   MCV 90.3 10/05/2020   PLT 198 10/05/2020    --/--/O POS (08/10 0051)/RI  A/P Post partum day 2.  Routine care.  Expect d/c today.    Daria Pastures

## 2021-05-07 ENCOUNTER — Encounter: Payer: Self-pay | Admitting: Dermatology

## 2021-06-04 ENCOUNTER — Ambulatory Visit (INDEPENDENT_AMBULATORY_CARE_PROVIDER_SITE_OTHER): Payer: BC Managed Care – PPO | Admitting: Dermatology

## 2021-06-04 DIAGNOSIS — L720 Epidermal cyst: Secondary | ICD-10-CM

## 2021-06-04 DIAGNOSIS — Z1283 Encounter for screening for malignant neoplasm of skin: Secondary | ICD-10-CM | POA: Diagnosis not present

## 2021-06-04 DIAGNOSIS — Z86018 Personal history of other benign neoplasm: Secondary | ICD-10-CM

## 2021-06-04 DIAGNOSIS — L719 Rosacea, unspecified: Secondary | ICD-10-CM

## 2021-06-04 DIAGNOSIS — L700 Acne vulgaris: Secondary | ICD-10-CM

## 2021-06-04 DIAGNOSIS — W57XXXA Bitten or stung by nonvenomous insect and other nonvenomous arthropods, initial encounter: Secondary | ICD-10-CM

## 2021-06-04 DIAGNOSIS — D229 Melanocytic nevi, unspecified: Secondary | ICD-10-CM

## 2021-06-04 DIAGNOSIS — L988 Other specified disorders of the skin and subcutaneous tissue: Secondary | ICD-10-CM

## 2021-06-04 DIAGNOSIS — S30861A Insect bite (nonvenomous) of abdominal wall, initial encounter: Secondary | ICD-10-CM

## 2021-06-04 MED ORDER — TRETINOIN 0.025 % EX CREA
TOPICAL_CREAM | Freq: Every day | CUTANEOUS | 11 refills | Status: AC
Start: 2021-06-04 — End: 2022-06-04

## 2021-06-04 NOTE — Patient Instructions (Addendum)
Topical retinoid medications like tretinoin/Retin-A, adapalene/Differin, tazarotene/Fabior, and Epiduo/Epiduo Forte can cause dryness and irritation when first started. Only apply a pea-sized amount to the entire affected area. Avoid applying it around the eyes, edges of mouth and creases at the nose. If you experience irritation, use a good moisturizer first and/or apply the medicine less often. If you are doing well with the medicine, you can increase how often you use it until you are applying every night. Be careful with sun protection while using this medication as it can make you sensitive to the sun. This medicine should not be used by pregnant women.  ? ? ?Melanoma ABCDEs ? ?Melanoma is the most dangerous type of skin cancer, and is the leading cause of death from skin disease.  You are more likely to develop melanoma if you: ?Have light-colored skin, light-colored eyes, or red or blond hair ?Spend a lot of time in the sun ?Tan regularly, either outdoors or in a tanning bed ?Have had blistering sunburns, especially during childhood ?Have a close family member who has had a melanoma ?Have atypical moles or large birthmarks ? ?Early detection of melanoma is key since treatment is typically straightforward and cure rates are extremely high if we catch it early.  ? ?The first sign of melanoma is often a change in a mole or a new dark spot.  The ABCDE system is a way of remembering the signs of melanoma. ? ?A for asymmetry:  The two halves do not match. ?B for border:  The edges of the growth are irregular. ?C for color:  A mixture of colors are present instead of an even brown color. ?D for diameter:  Melanomas are usually (but not always) greater than 48m - the size of a pencil eraser. ?E for evolution:  The spot keeps changing in size, shape, and color. ? ?Please check your skin once per month between visits. You can use a small mirror in front and a large mirror behind you to keep an eye on the back side or  your body.  ? ?If you see any new or changing lesions before your next follow-up, please call to schedule a visit. ? ?Please continue daily skin protection including broad spectrum sunscreen SPF 30+ to sun-exposed areas, reapplying every 2 hours as needed when you're outdoors.   ? ?If You Need Anything After Your Visit ? ?If you have any questions or concerns for your doctor, please call our main line at [phone] press option 4 to reach your doctor's medical assistant. If no one answers, please leave a voicemail as directed and we will return your call as soon as possible. Messages left after 4 pm will be answered the following business day.  ? ?You may also send [place] message via MyChart. We typically respond to MyChart messages within 1-2 business days. ? ?For prescription refills, please ask your pharmacy to contact our office. Our fax number is [phone] ? ?If you have an urgent issue when the clinic is closed that cannot wait until the next business day, you can page your doctor at the number below.   ? ?Please note that while we do our best to be available for urgent issues outside of office hours, we are not available 24/7.  ? ?If you have an urgent issue and are unable to reach [place] you may choose to seek medical care at your doctor's office, retail clinic, urgent care center, or emergency room. ? ?If you have a medical emergency,  please immediately call 911 or go to the emergency department. ? ?Pager Numbers ? ?- Dr. Nehemiah Massed: [phone] ? ?- Dr. Laurence Ferrari: [phone] ? ?- Dr. Nicole Kindred: [phone] ? ?In the event of inclement weather, please call our main line at [phone] for an update on the status of any delays or closures. ? ?Dermatology Medication Tips: ?Please keep the boxes that topical medications come in in order to help keep track of the instructions about where and how to use these. Pharmacies typically print the medication instructions only on the boxes and not directly on the  medication tubes.  ? ?If your medication is too expensive, please contact our office at [phone] option 4 or send [place] a message through [place].  ? ?We are unable to tell what your co-pay for medications will be in advance as this is different depending on your insurance coverage. However, we may be able to find a substitute medication at lower cost or fill out paperwork to get insurance to cover a needed medication.  ? ?If a prior authorization is required to get your medication covered by your insurance company, please allow [place] 1-2 business days to complete this process. ? ?Drug prices often vary depending on where the prescription is filled and some pharmacies may offer cheaper prices. ? ?The website www.goodrx.com contains coupons for medications through different pharmacies. The prices here do not account for what the cost may be with help from insurance (it may be cheaper with your insurance), but the website can give you the price if you did not use any insurance.  ?- You can print the associated coupon and take it with your prescription to the pharmacy.  ?- You may also stop by our office during regular business hours and pick up a GoodRx coupon card.  ?- If you need your prescription sent electronically to a different pharmacy, notify our office through [facility] or by phone at [phone] option 4. ? ? ? ? ?Si Usted Necesita Algo Despu?s de Su Visita ? ?Tambi?n puede enviarnos un mensaje a trav?s de MyChart. Por lo general respondemos a los mensajes de MyChart en el transcurso de 1 a 2 d?as h?biles. ? ?Para renovar recetas, por favor pida a su farmacia que se ponga en contacto con nuestra oficina. Nuestro n?mero de fax es el [phone]. ? ?Si tiene un asunto urgente cuando la cl?nica est? cerrada y que no puede esperar hasta el siguiente d?a h?bil, puede llamar/localizar a su doctor(a) al n?mero que aparece a continuaci?n.  ? ?Por favor, tenga en cuenta que aunque hacemos todo lo posible  para estar disponibles para asuntos urgentes fuera del horario de oficina, no estamos disponibles las 24 horas del d?a, los 7 d?as de la semana.  ? ?Si tiene un problema urgente y no puede comunicarse con nosotros, puede optar por buscar atenci?n m?dica  en el consultorio de su doctor(a), en una cl?nica privada, en un centro de atenci?n urgente o en una sala de emergencias. ? ?Si tiene Engineer, maintenance (IT) m?dica, por favor llame inmediatamente al 911 o vaya a la sala de emergencias. ? ?N?meros de b?per ? ?- Dr. Nehemiah Massed: [phone] ? ?- Dra. Moye: [phone] ? ?- Dra. Nicole Kindred: [phone] ? ?En caso de inclemencias del tiempo, por favor llame a nuestra l?nea principal al [phone] para una actualizaci?n sobre el estado de cualquier retraso o cierre. ? ?Consejos para la medicaci?n en dermatolog?a: ?Por favor, guarde las cajas en las que vienen los medicamentos de uso t?pico para ayudarle a seguir  las instrucciones sobre d?nde y c?mo usarlos. Las farmacias generalmente imprimen las instrucciones del medicamento s?lo en las cajas y no directamente en los tubos del [place].  ? ?Si su medicamento es muy caro, por favor, p?ngase en contacto con Zigmund Daniel llamando al [phone] y presione la opci?n 4 o env?enos un mensaje a trav?s de MyChart.  ? ?No podemos decirle cu?l ser? su copago por los medicamentos por adelantado ya que esto es diferente dependiendo de la cobertura de su seguro. Sin embargo, es posible que podamos encontrar un medicamento sustituto a Electrical engineer un formulario para que el seguro cubra el medicamento que se considera necesario.  ? ?Si se requiere Ardelia Mems autorizaci?n previa para que su compa??a de seguros [place] su medicamento, por favor perm?tanos de 1 a 2 d?as h?biles para completar este proceso. ? ?Los precios de los medicamentos var?an con frecuencia dependiendo del Environmental consultant de d?nde se surte la receta y alguna farmacias pueden ofrecer precios m?s baratos. ? ?El sitio web  www.goodrx.com tiene cupones para medicamentos de Airline pilot. Los precios aqu? no tienen en cuenta lo que podr?a costar con la ayuda del seguro (puede ser m?s barato con su seguro), pero el sitio web puede d

## 2021-06-04 NOTE — Progress Notes (Signed)
? ?Follow-Up Visit ?  ?Subjective  ?Yesenia Macias is a 26 y.o. female who presents for the following: Annual Exam (Patient here for full body skin exam and skin cancer screening. 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 Patient would like to discuss using tretinoin for acne and facial elastosis.  ?The patient presents for Total-Body Skin Exam (TBSE) for skin cancer screening and mole check.  The patient has spots, moles and lesions to be evaluated, some may be new or changing and the patient has concerns that these could be cancer. ? ?The following portions of the chart were reviewed this encounter and updated as appropriate:  ? Tobacco  Allergies  Meds  Problems  Med Hx  Surg Hx  Fam Hx   ?  ?Review of Systems:  No other skin or systemic complaints except as noted in HPI or Assessment and Plan. ? ?Objective  ?Well appearing patient in no apparent distress; mood and affect are within normal limits. ? ?A full examination was performed including scalp, head, eyes, ears, nose, lips, neck, chest, axillae, abdomen, back, buttocks, bilateral upper extremities, bilateral lower extremities, hands, feet, fingers, toes, fingernails, and toenails. All findings within normal limits unless otherwise noted below. ? ?face ?Mild erythema  ? ?face ?Mild  ? ?right forehead ?Smooth white papule(s).  ? ?face ?Rhytides and volume loss.  ? ? ?Assessment & Plan  ?Rosacea ?face ?Rosacea is a chronic progressive skin condition usually affecting the face of adults, causing redness and/or acne bumps. It is treatable but not curable. It sometimes affects the eyes (ocular rosacea) as well. It may respond to topical and/or systemic medication and can flare with stress, sun exposure, alcohol, exercise and some foods.  Daily application of broad spectrum spf 30+ sunscreen to face is recommended to reduce flares. ?Patient declines treatment today. ? ?Acne vulgaris ?face ?Start tretinoin  0.025% cream QHS as tolerated ?Topical retinoid medications like tretinoin/Retin-A, adapalene/Differin, tazarotene/Fabior, and Epiduo/Epiduo Forte can cause dryness and irritation when first started. Only apply a pea-sized amount to the entire affected area. Avoid applying it around the eyes, edges of mouth and creases at the nose. If you experience irritation, use a good moisturizer first and/or apply the medicine less often. If you are doing well with the medicine, you can increase how often you use it until you are applying every night. Be careful with sun protection while using this medication as it can make you sensitive to the sun. This medicine should not be used by pregnant women.  ? ?tretinoin (RETIN-A) 0.025 % cream - face ?Apply topically at bedtime. ? ?Milia and acne ?Face ?Start tretinoin 0.025% cream QHS as tolerated ?Topical retinoid medications like tretinoin/Retin-A, adapalene/Differin, tazarotene/Fabior, and Epiduo/Epiduo Forte can cause dryness and irritation when first started. Only apply a pea-sized amount to the entire affected area. Avoid applying it around the eyes, edges of mouth and creases at the nose. If you experience irritation, use a good moisturizer first and/or apply the medicine less often. If you are doing well with the medicine, you can increase how often you use it until you are applying every night. Be careful with sun protection while using this medication as it can make you sensitive to the sun. This medicine should not be used by pregnant women.  ? ?Elastosis of skin ?face ?Start tretinoin 0.025% cream QHS as tolerated ?Topical retinoid medications like tretinoin/Retin-A, adapalene/Differin, tazarotene/Fabior, and Epiduo/Epiduo Forte can cause dryness and irritation when first started.  Only apply a pea-sized amount to the entire affected area. Avoid applying it around the eyes, edges of mouth and creases at the nose. If you experience irritation, use a good moisturizer first  and/or apply the medicine less often. If you are doing well with the medicine, you can increase how often you use it until you are applying every night. Be careful with sun protection while using this medication as it can make you sensitive to the sun. This medicine should not be used by pregnant women.  ? ?Reaction to insect bite ?right abdomen ?Patient advises no symptoms since tick bites.  ?Patient to RTC or follow up with PCP if any fever or rash or other symptoms develops.  ? ?History of Atypical Pigmented Spindle Cell Nevus ?- No evidence of recurrence today at left forearm, excised 06/23/2018 ?- Recommend regular full body skin exams ?- Recommend daily broad spectrum sunscreen SPF 30+ to sun-exposed areas, reapply every 2 hours as needed.  ?- Call if any new or changing lesions are noted between office visits ? ?Melanocytic Nevi ?- Tan-brown and/or pink-flesh-colored symmetric macules and papules ?- Benign appearing on exam today ?- Observation ?- Call clinic for new or changing moles ?- Recommend daily use of broad spectrum spf 30+ sunscreen to sun-exposed areas.  ? ?Return in about 1 year (around 06/05/2022) for TBSE. ? ?Graciella Belton, RMA, am acting as scribe for Sarina Ser, MD . ?Documentation: I have reviewed the above documentation for accuracy and completeness, and I agree with the above. ? ?Sarina Ser, MD ? ?

## 2021-06-05 ENCOUNTER — Encounter: Payer: Self-pay | Admitting: Dermatology

## 2022-06-05 ENCOUNTER — Ambulatory Visit: Payer: BC Managed Care – PPO | Admitting: Dermatology

## 2022-07-28 IMAGING — US US MFM OB DETAIL+14 WK
1 series · 13 of 28 positions shown · non-contrast
Comparison: none

[Series 1: us mfm ob detail+14 wk · 13 of 118 slices shown]
[im 5/118]
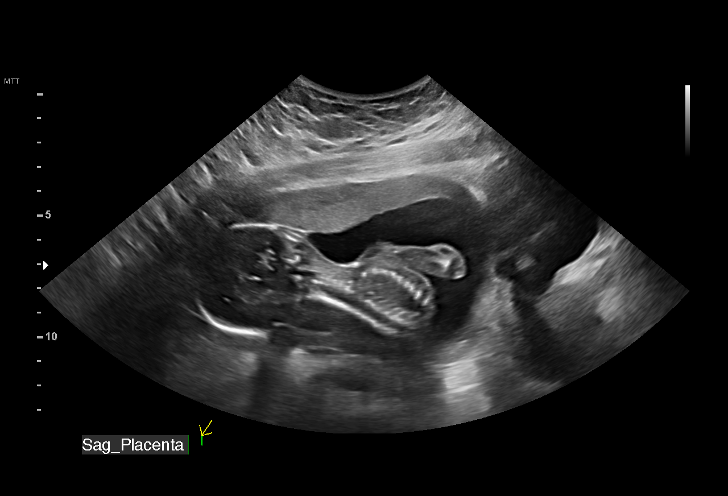
[im 14/118]
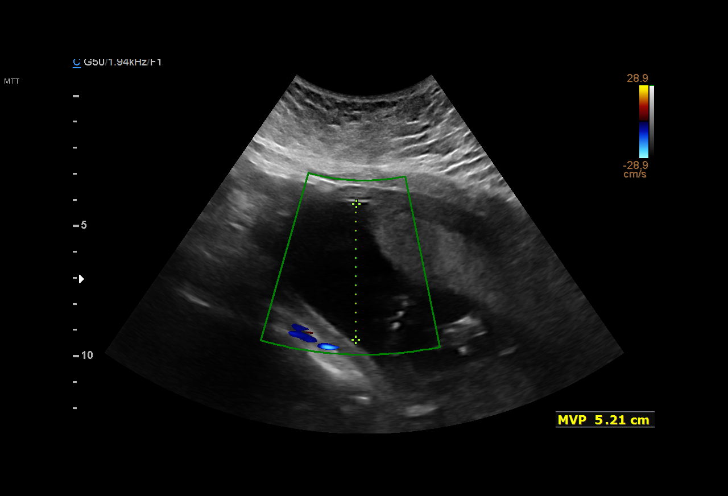
[im 22/118]
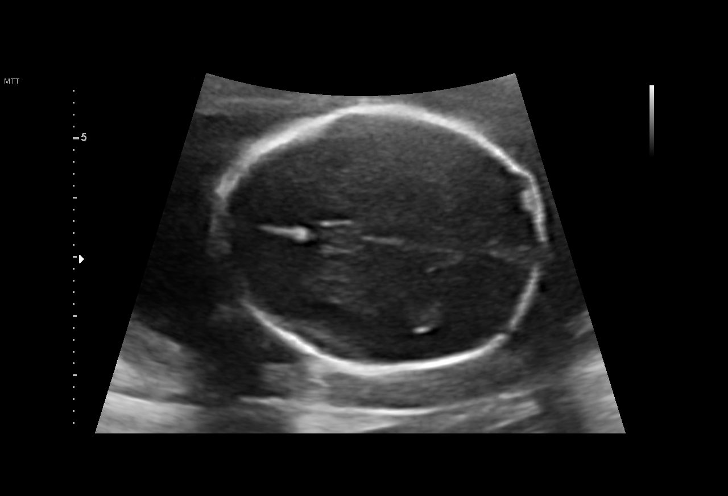
[im 31/118]
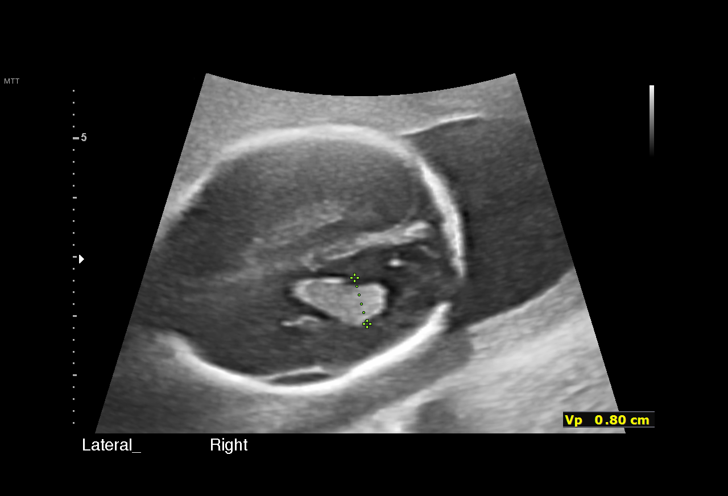
[im 40/118]
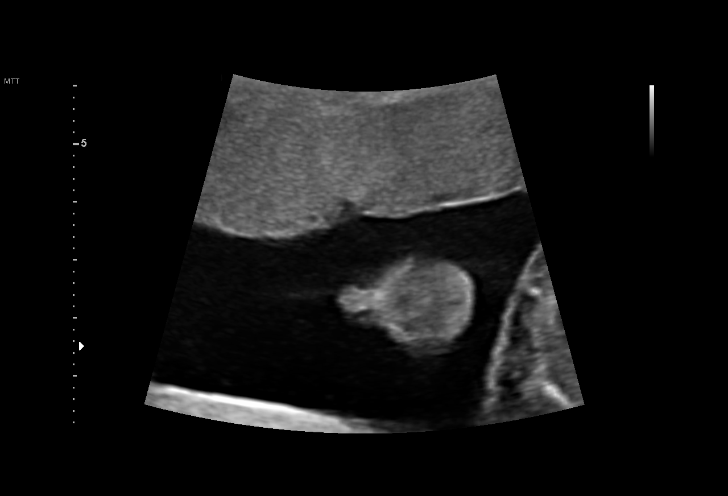
[im 48/118]
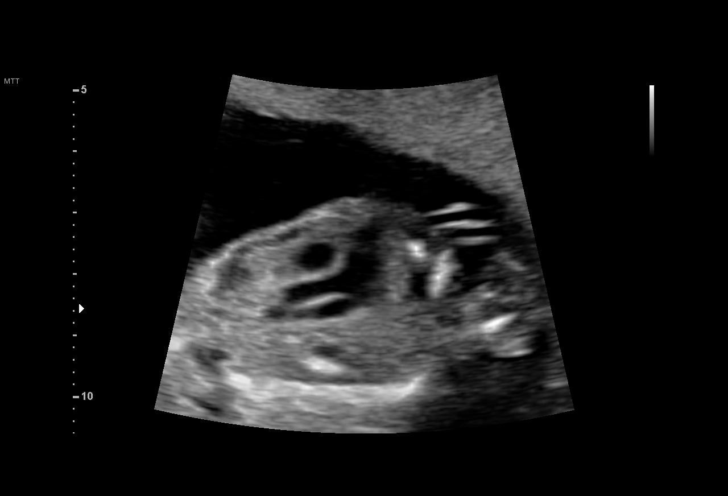
[im 61/118]
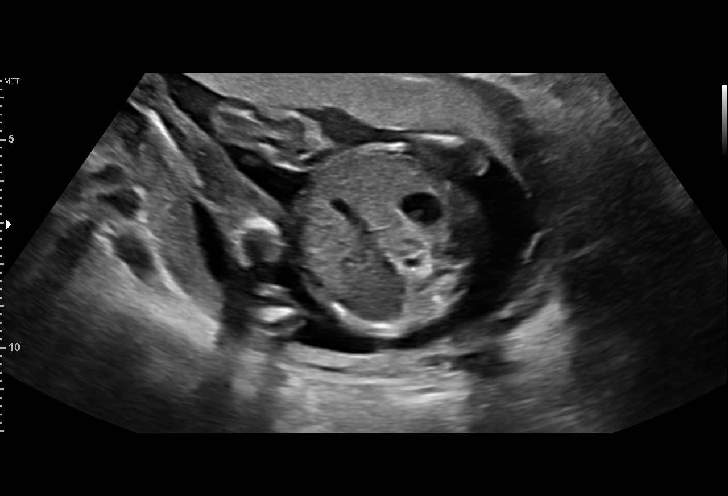
[im 70/118]
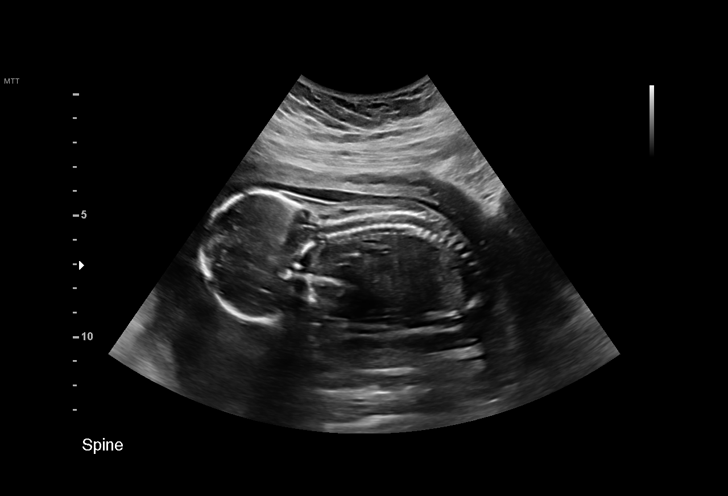
[im 79/118]
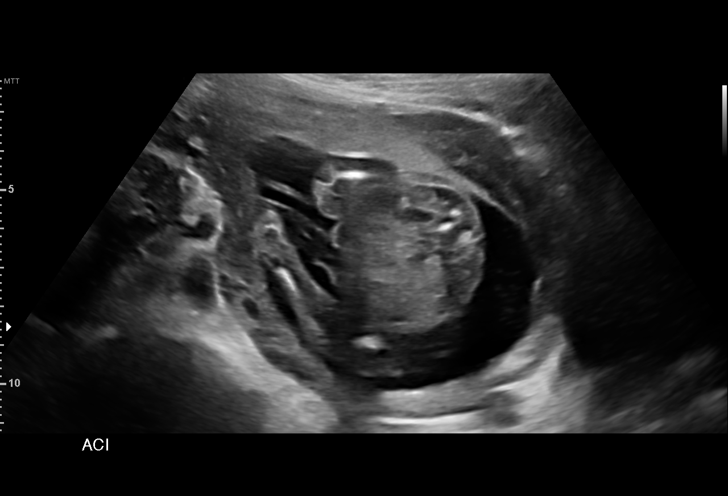
[im 87/118]
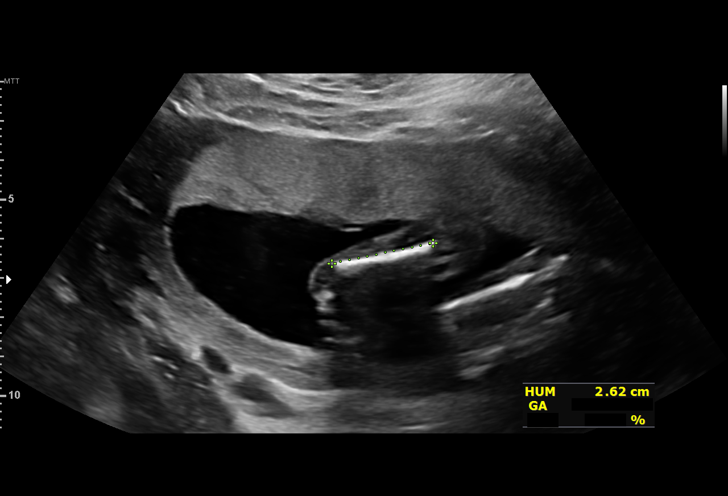
[im 96/118]
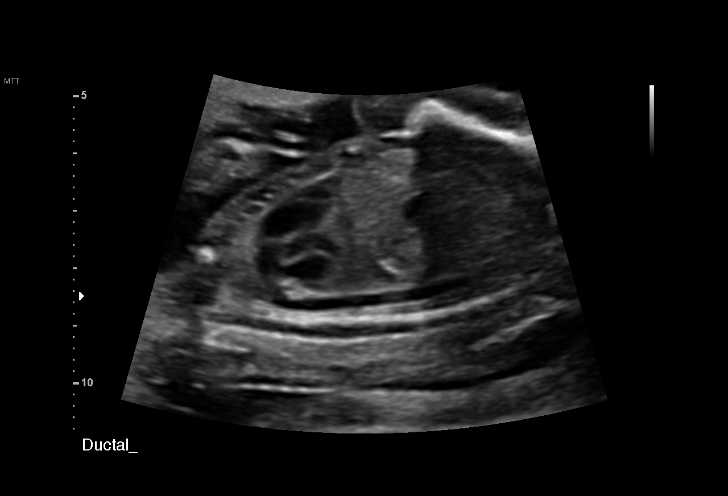
[im 105/118]
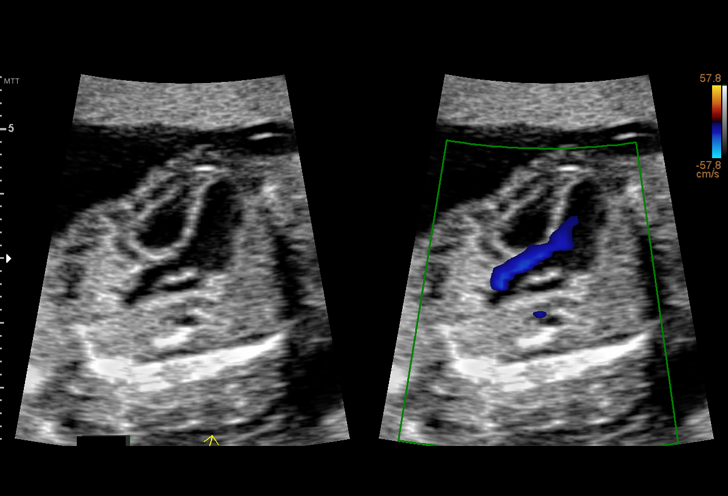
[im 113/118]
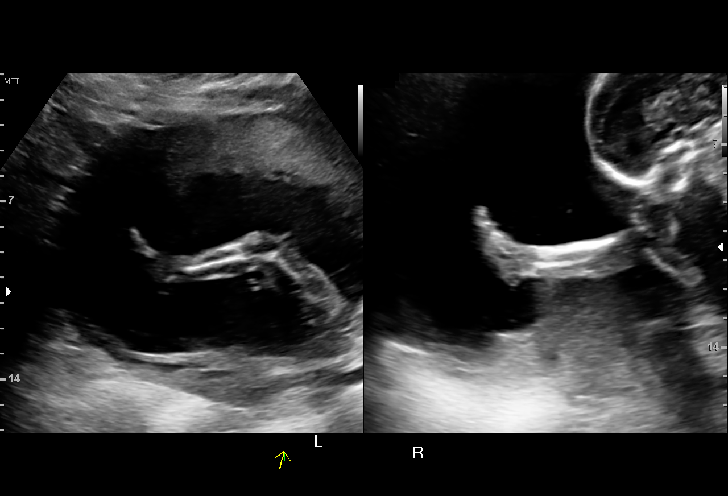

[13 of 28 positions shown; findings below may reference images not displayed]

OBGYN

Indications

 19 weeks gestation of pregnancy
 Encounter for antenatal screening for
 malformations
 Other mental disorder complicating
 pregnancy, unspecified trimester (Anxiety)
 LR NIPT
 Echogenic intracardiac focus of the heart
 (EIF)
Fetal Evaluation

 Num Of Fetuses:         1
 Fetal Heart Rate(bpm):  148
 Cardiac Activity:       Observed
 Presentation:           Breech
 Placenta:               Anterior
 P. Cord Insertion:      Visualized, central

 Amniotic Fluid
 AFI FV:      Within normal limits

                             Largest Pocket(cm)

Biometry

 BPD:      43.5  mm     G. Age:  19w 1d         45  %    CI:        75.54   %    70 - 86
                                                         FL/HC:      17.5   %    16.1 -
 HC:      158.7  mm     G. Age:  18w 5d         18  %    HC/AC:      1.08        1.09 -
 AC:      147.6  mm     G. Age:  20w 0d         71  %    FL/BPD:     63.7   %
 FL:       27.7  mm     G. Age:  18w 3d         17  %    FL/AC:      18.8   %    20 - 24
 HUM:      26.8  mm     G. Age:  18w 4d         30  %
 CER:      19.6  mm     G. Age:  19w 0d         38  %
 NFT:       3.0  mm
 LV:          8  mm
 CM:        6.2  mm

 Est. FW:     282  gm    0 lb 10 oz      43  %
OB History

 Blood Type:   O+
 Gravidity:    2         Term:   0        Prem:   0        SAB:   1
 TOP:          0       Ectopic:  0        Living: 0
Gestational Age

 LMP:           18w 1d        Date:  12/30/19                 EDD:   10/05/20
 Clinical EDD:  19w 2d                                        EDD:   09/27/20
 U/S Today:     19w 1d                                        EDD:   09/28/20
 Best:          19w 2d     Det. By:  Early Exam  (03/03/20)   EDD:   09/27/20
Anatomy

 Cranium:               Appears normal         Aortic Arch:            Appears normal
 Cavum:                 Appears normal         Ductal Arch:            Appears normal
 Ventricles:            Appears normal         Diaphragm:              Appears normal
 Choroid Plexus:        Appears normal         Stomach:                Appears normal, left
                                                                       sided
 Cerebellum:            Appears normal         Abdomen:                Appears normal
 Posterior Fossa:       Appears normal         Abdominal Wall:         Appears nml (cord
                                                                       insert, abd wall)
 Nuchal Fold:           Appears normal         Cord Vessels:           Appears normal (3
                                                                       vessel cord)
 Face:                  Appears normal         Kidneys:                Appear normal
                        (orbits and profile)
 Lips:                  Appears normal         Bladder:                Appears normal
 Thoracic:              Appears normal         Spine:                  Appears normal
 Heart:                 Appears normal; EIF    Upper Extremities:      Appears normal
 RVOT:                  Appears normal         Lower Extremities:      Appears normal
 LVOT:                  Appears normal

 Other:  Fetus appears to be a male. Nasal bone visualized. Heels/feet and
         open hands/5th digits visualized.
Cervix Uterus Adnexa

 Cervix
 Length:           4.36  cm.
 Normal appearance by transabdominal scan.
 Uterus
 No abnormality visualized.

 Right Ovary
 Within normal limits.

 Left Ovary
 Within normal limits.

 Cul De Sac
 No free fluid seen.

 Adnexa
 No abnormality visualized.
Impression

 G2 P0.
 On cell-free fetal DNA screening, the risks of fetal
 aneuploidies are not increased .
 We performed fetal anatomy scan. An echogenic intracardiac
 focus is seen. No other makers of aneuploidies or fetal
 structural defects are seen. Fetal biometry is consistent with
 her previously-established dates. Amniotic fluid is normal and
 good fetal activity is seen.
 I informed the patient that given that she had Blain Jumper for fetal
 aneuploidies on cell-free fetal DNA screening, finding of
 echogenic intracardiac focus should be considered a normal
 variant and that the risk of trisomy 21 is not increased. I also
 reassured that echogenic focus does not increase the risk of
 cardiac defects. I also informed her that only amniocentesis
 will give a defintive result on the fetal karyotype.
 Patient opted not to have amniocentesis.
Recommendations

 Follow-up scans as clinically indicated.
                 Tiger, Alia

## 2022-09-24 ENCOUNTER — Ambulatory Visit: Payer: BC Managed Care – PPO | Admitting: Dermatology

## 2023-02-26 NOTE — L&D Delivery Note (Addendum)
 Delivery Note After laboring down for an hour, pt noted to be complete. She then pushed for about 35 mins and at 2:20 PM a viable female was delivered via Vaginal, Spontaneous (Presentation: Right Occiput Transverse).  APGAR: 8, 9; weight pending Anterior and posterior shoulders delivered easily with next two pushed; body followed Bulb suction of mouth and nose ws done. Cord was clamped and cut after a minute delay. Cord blood obtained.  .   Placenta status: Expressed, Intact.  Yesenia Macias. Cord: 3 vessels with the following complications: Short.  Cord pH: n/a  Anesthesia: Epidural Episiotomy: None Lacerations: Right periurethral abrasion - no repair needed  Suture Repair: n/a Est. Blood Loss (mL):  72ml  Mom to postpartum.  Baby to Couplet care / Skin to Skin.  Yesenia Macias 11/17/2023, 2:31 PM

## 2023-04-21 LAB — OB RESULTS CONSOLE GC/CHLAMYDIA
Chlamydia: NEGATIVE
Neisseria Gonorrhea: NEGATIVE

## 2023-04-21 LAB — OB RESULTS CONSOLE HIV ANTIBODY (ROUTINE TESTING): HIV: NONREACTIVE

## 2023-04-21 LAB — OB RESULTS CONSOLE RPR: RPR: NONREACTIVE

## 2023-04-21 LAB — OB RESULTS CONSOLE RUBELLA ANTIBODY, IGM: Rubella: IMMUNE

## 2023-04-21 LAB — OB RESULTS CONSOLE ANTIBODY SCREEN: Antibody Screen: NEGATIVE

## 2023-04-21 LAB — HEPATITIS C ANTIBODY: HCV Ab: NEGATIVE

## 2023-04-21 LAB — OB RESULTS CONSOLE HEPATITIS B SURFACE ANTIGEN: Hepatitis B Surface Ag: NEGATIVE

## 2023-10-16 LAB — OB RESULTS CONSOLE GBS: GBS: NEGATIVE

## 2023-11-12 ENCOUNTER — Telehealth (HOSPITAL_COMMUNITY): Payer: Self-pay | Admitting: *Deleted

## 2023-11-12 ENCOUNTER — Encounter (HOSPITAL_COMMUNITY): Payer: Self-pay | Admitting: *Deleted

## 2023-11-12 NOTE — Telephone Encounter (Signed)
 Preadmission screen

## 2023-11-13 ENCOUNTER — Encounter (HOSPITAL_COMMUNITY): Payer: Self-pay | Admitting: *Deleted

## 2023-11-13 ENCOUNTER — Telehealth (HOSPITAL_COMMUNITY): Payer: Self-pay | Admitting: *Deleted

## 2023-11-13 NOTE — Telephone Encounter (Signed)
 Preadmission screen

## 2023-11-17 ENCOUNTER — Encounter (HOSPITAL_COMMUNITY): Payer: Self-pay | Admitting: Obstetrics and Gynecology

## 2023-11-17 ENCOUNTER — Other Ambulatory Visit: Payer: Self-pay

## 2023-11-17 ENCOUNTER — Inpatient Hospital Stay (HOSPITAL_COMMUNITY): Admitting: Anesthesiology

## 2023-11-17 ENCOUNTER — Inpatient Hospital Stay (HOSPITAL_COMMUNITY)
Admission: AD | Admit: 2023-11-17 | Discharge: 2023-11-18 | DRG: 807 | Disposition: A | Discharge status: Home / Self Care | Attending: Obstetrics and Gynecology | Admitting: Obstetrics and Gynecology

## 2023-11-17 DIAGNOSIS — Z3A40 40 weeks gestation of pregnancy: Secondary | ICD-10-CM | POA: Diagnosis not present

## 2023-11-17 DIAGNOSIS — Z87891 Personal history of nicotine dependence: Secondary | ICD-10-CM

## 2023-11-17 DIAGNOSIS — O480 Post-term pregnancy: Principal | ICD-10-CM | POA: Diagnosis present

## 2023-11-17 DIAGNOSIS — Z833 Family history of diabetes mellitus: Secondary | ICD-10-CM

## 2023-11-17 DIAGNOSIS — O26893 Other specified pregnancy related conditions, third trimester: Secondary | ICD-10-CM | POA: Diagnosis present

## 2023-11-17 DIAGNOSIS — Z3493 Encounter for supervision of normal pregnancy, unspecified, third trimester: Principal | ICD-10-CM

## 2023-11-17 LAB — CBC
HCT: 39.7 % (ref 36.0–46.0)
Hemoglobin: 13.8 g/dL (ref 12.0–15.0)
MCH: 30.6 pg (ref 26.0–34.0)
MCHC: 34.8 g/dL (ref 30.0–36.0)
MCV: 88 fL (ref 80.0–100.0)
Platelets: 175 K/uL (ref 150–400)
RBC: 4.51 MIL/uL (ref 3.87–5.11)
RDW: 13.5 % (ref 11.5–15.5)
WBC: 13.6 K/uL — ABNORMAL HIGH (ref 4.0–10.5)
nRBC: 0 % (ref 0.0–0.2)

## 2023-11-17 LAB — RPR: RPR Ser Ql: NONREACTIVE

## 2023-11-17 LAB — TYPE AND SCREEN
ABO/RH(D): O POS
Antibody Screen: NEGATIVE

## 2023-11-17 MED ORDER — LACTATED RINGERS IV SOLN
500.0000 mL | INTRAVENOUS | Status: DC | PRN
  Administered 2023-11-17: 500 mL via INTRAVENOUS

## 2023-11-17 MED ORDER — OXYCODONE-ACETAMINOPHEN 5-325 MG PO TABS: 2.0000 | ORAL_TABLET | ORAL | Status: DC | PRN

## 2023-11-17 MED ORDER — DIPHENHYDRAMINE HCL 25 MG PO CAPS: 25.0000 mg | ORAL_CAPSULE | Freq: Four times a day (QID) | ORAL | Status: DC | PRN

## 2023-11-17 MED ORDER — FENTANYL-BUPIVACAINE-NACL 0.5-0.125-0.9 MG/250ML-% EP SOLN
12.0000 mL/h | EPIDURAL | Status: DC | PRN
  Administered 2023-11-17: 12 mL/h via EPIDURAL
  Filled 2023-11-17: qty 250

## 2023-11-17 MED ORDER — ZOLPIDEM TARTRATE 5 MG PO TABS: 5.0000 mg | ORAL_TABLET | Freq: Every evening | ORAL | Status: DC | PRN

## 2023-11-17 MED ORDER — DIPHENHYDRAMINE HCL 50 MG/ML IJ SOLN: 12.5000 mg | INTRAMUSCULAR | Status: DC | PRN

## 2023-11-17 MED ORDER — ONDANSETRON HCL 4 MG/2ML IJ SOLN: 4.0000 mg | Freq: Four times a day (QID) | INTRAMUSCULAR | Status: DC | PRN

## 2023-11-17 MED ORDER — SOD CITRATE-CITRIC ACID 500-334 MG/5ML PO SOLN: 30.0000 mL | ORAL | Status: DC | PRN

## 2023-11-17 MED ORDER — OXYCODONE-ACETAMINOPHEN 5-325 MG PO TABS: 1.0000 | ORAL_TABLET | ORAL | Status: DC | PRN

## 2023-11-17 MED ORDER — IBUPROFEN 600 MG PO TABS
600.0000 mg | ORAL_TABLET | Freq: Four times a day (QID) | ORAL | Status: DC
  Administered 2023-11-17 – 2023-11-18 (×4): 600 mg via ORAL
  Filled 2023-11-17 (×4): qty 1

## 2023-11-17 MED ORDER — PHENYLEPHRINE 80 MCG/ML (10ML) SYRINGE FOR IV PUSH (FOR BLOOD PRESSURE SUPPORT)
80.0000 ug | PREFILLED_SYRINGE | INTRAVENOUS | Status: DC | PRN
  Filled 2023-11-17 (×3): qty 10

## 2023-11-17 MED ORDER — OXYTOCIN-SODIUM CHLORIDE 30-0.9 UT/500ML-% IV SOLN: 2.5000 [IU]/h | INTRAVENOUS | Status: DC | PRN

## 2023-11-17 MED ORDER — COCONUT OIL OIL
1.0000 | TOPICAL_OIL | Status: DC | PRN
  Administered 2023-11-17: 1 via TOPICAL

## 2023-11-17 MED ORDER — LIDOCAINE HCL (PF) 1 % IJ SOLN
INTRAMUSCULAR | Status: DC | PRN
  Administered 2023-11-17: 5 mL via EPIDURAL

## 2023-11-17 MED ORDER — TERBUTALINE SULFATE 1 MG/ML IJ SOLN: 0.2500 mg | Freq: Once | INTRAMUSCULAR | Status: DC | PRN

## 2023-11-17 MED ORDER — SENNOSIDES-DOCUSATE SODIUM 8.6-50 MG PO TABS
2.0000 | ORAL_TABLET | Freq: Every day | ORAL | Status: DC
  Administered 2023-11-18: 2 via ORAL
  Filled 2023-11-17: qty 2

## 2023-11-17 MED ORDER — MISOPROSTOL 50MCG HALF TABLET: 50.0000 ug | ORAL_TABLET | ORAL | Status: DC | PRN

## 2023-11-17 MED ORDER — EPHEDRINE 5 MG/ML INJ: 10.0000 mg | INTRAVENOUS | Status: DC | PRN

## 2023-11-17 MED ORDER — PHENYLEPHRINE 80 MCG/ML (10ML) SYRINGE FOR IV PUSH (FOR BLOOD PRESSURE SUPPORT)
80.0000 ug | PREFILLED_SYRINGE | INTRAVENOUS | Status: DC | PRN
  Administered 2023-11-17: 80 ug via INTRAVENOUS
  Filled 2023-11-17: qty 10

## 2023-11-17 MED ORDER — LACTATED RINGERS IV SOLN: INTRAVENOUS | Status: DC

## 2023-11-17 MED ORDER — LACTATED RINGERS IV SOLN: 500.0000 mL | Freq: Once | INTRAVENOUS | Status: DC

## 2023-11-17 MED ORDER — OXYCODONE HCL 5 MG PO TABS: 5.0000 mg | ORAL_TABLET | ORAL | Status: DC | PRN

## 2023-11-17 MED ORDER — ONDANSETRON HCL 4 MG/2ML IJ SOLN: 4.0000 mg | INTRAMUSCULAR | Status: DC | PRN

## 2023-11-17 MED ORDER — SIMETHICONE 80 MG PO CHEW: 80.0000 mg | CHEWABLE_TABLET | ORAL | Status: DC | PRN

## 2023-11-17 MED ORDER — ACETAMINOPHEN 325 MG PO TABS: 650.0000 mg | ORAL_TABLET | ORAL | Status: DC | PRN

## 2023-11-17 MED ORDER — OXYTOCIN-SODIUM CHLORIDE 30-0.9 UT/500ML-% IV SOLN
2.5000 [IU]/h | INTRAVENOUS | Status: DC
  Administered 2023-11-17: 2.5 [IU]/h via INTRAVENOUS

## 2023-11-17 MED ORDER — OXYTOCIN BOLUS FROM INFUSION
333.0000 mL | Freq: Once | INTRAVENOUS | Status: AC
  Administered 2023-11-17: 333 mL via INTRAVENOUS

## 2023-11-17 MED ORDER — PRENATAL MULTIVITAMIN CH
1.0000 | ORAL_TABLET | Freq: Every day | ORAL | Status: DC
  Administered 2023-11-18: 1 via ORAL
  Filled 2023-11-17: qty 1

## 2023-11-17 MED ORDER — OXYTOCIN-SODIUM CHLORIDE 30-0.9 UT/500ML-% IV SOLN
1.0000 m[IU]/min | INTRAVENOUS | Status: DC
  Administered 2023-11-17: 2 m[IU]/min via INTRAVENOUS
  Filled 2023-11-17: qty 500

## 2023-11-17 MED ORDER — LIDOCAINE HCL (PF) 1 % IJ SOLN: 30.0000 mL | INTRAMUSCULAR | Status: DC | PRN

## 2023-11-17 MED ORDER — LIDOCAINE-EPINEPHRINE (PF) 1.5 %-1:200000 IJ SOLN
INTRAMUSCULAR | Status: DC | PRN
  Administered 2023-11-17: 5 mL via EPIDURAL

## 2023-11-17 MED ORDER — TETANUS-DIPHTH-ACELL PERTUSSIS 5-2.5-18.5 LF-MCG/0.5 IM SUSY: 0.5000 mL | PREFILLED_SYRINGE | Freq: Once | INTRAMUSCULAR | Status: DC

## 2023-11-17 MED ORDER — DIBUCAINE (PERIANAL) 1 % EX OINT: 1.0000 | TOPICAL_OINTMENT | CUTANEOUS | Status: DC | PRN

## 2023-11-17 MED ORDER — WITCH HAZEL-GLYCERIN EX PADS: 1.0000 | MEDICATED_PAD | CUTANEOUS | Status: DC | PRN

## 2023-11-17 MED ORDER — OXYTOCIN-SODIUM CHLORIDE 30-0.9 UT/500ML-% IV SOLN: 1.0000 m[IU]/min | INTRAVENOUS | Status: DC

## 2023-11-17 MED ORDER — OXYCODONE HCL 5 MG PO TABS: 10.0000 mg | ORAL_TABLET | ORAL | Status: DC | PRN

## 2023-11-17 MED ORDER — ONDANSETRON HCL 4 MG PO TABS: 4.0000 mg | ORAL_TABLET | ORAL | Status: DC | PRN

## 2023-11-17 MED ORDER — BENZOCAINE-MENTHOL 20-0.5 % EX AERO
1.0000 | INHALATION_SPRAY | CUTANEOUS | Status: DC | PRN
  Administered 2023-11-17: 1 via TOPICAL
  Filled 2023-11-17: qty 56

## 2023-11-17 NOTE — H&P (Signed)
 Yesenia Macias is a 28 y.o. female presenting for evaluation of contractions. Worsening since last night. Was elective IOL from last Thursday that was delayed. +FM, denies VB, LOF.  H/o PPD after G1 GBS neg  OB History     Gravida  3   Para  1   Term  1   Preterm      AB  1   Living  1      SAB  1   IAB      Ectopic      Multiple  0   Live Births  1          Past Medical History:  Diagnosis Date   Atypical nevus 06/10/2018   Left volar forearm proximal. Atypical pigmented spindle cell nevus, limited margins free. Excised 06/23/2018, residual AMP, margins free.   Urinary frequency    Past Surgical History:  Procedure Laterality Date   WISDOM TOOTH EXTRACTION     Family History: family history includes Diabetes in her mother. Social History:  reports that she has quit smoking. She has never used smokeless tobacco. She reports that she does not use drugs. No history on file for alcohol use.     Maternal Diabetes: No1hr 101 Genetic Screening: Normal Maternal Ultrasounds/Referrals: Normal Fetal Ultrasounds or other Referrals:  None Maternal Substance Abuse:  No Significant Maternal Medications:  None Significant Maternal Lab Results:  Group B Strep negative Number of Prenatal Visits:greater than 3 verified prenatal visits Maternal Vaccinations:TDap Other Comments:  None  Review of Systems  Constitutional:  Negative for chills and fever.  Respiratory:  Negative for shortness of breath.   Cardiovascular:  Negative for chest pain, palpitations and leg swelling.  Gastrointestinal:  Negative for abdominal pain, nausea and vomiting.  Neurological:  Negative for dizziness, weakness and headaches.  Psychiatric/Behavioral:  Negative for suicidal ideas.    Maternal Medical History:  Reason for admission: Contractions.  Nausea.  Contractions: Onset was 3-5 hours ago.   Frequency: regular.   Fetal activity: Perceived fetal activity is normal.   Prenatal  complications: No PIH.   Prenatal Complications - Diabetes: none.   Dilation: 8.5 Effacement (%): 80, 90 Station: 0 Exam by:: Dr. Consuello Blood pressure 119/76, pulse 71, temperature 98.1 F (36.7 C), temperature source Oral, resp. rate 18, height 5' 1 (1.549 m), weight 87.3 kg, SpO2 100%, unknown if currently breastfeeding. Exam Physical Exam Constitutional:      General: She is not in acute distress.    Appearance: She is well-developed.  HENT:     Head: Normocephalic and atraumatic.  Eyes:     Pupils: Pupils are equal, round, and reactive to light.  Cardiovascular:     Rate and Rhythm: Normal rate and regular rhythm.     Heart sounds: No murmur heard.    No gallop.  Abdominal:     Tenderness: There is no abdominal tenderness. There is no guarding or rebound.  Genitourinary:    Vagina: Normal.  Musculoskeletal:        General: Normal range of motion.     Cervical back: Normal range of motion and neck supple.  Skin:    General: Skin is warm and dry.  Neurological:     Mental Status: She is alert and oriented to person, place, and time.     Prenatal labs: ABO, Rh: --/--/O POS (09/22 0215) Antibody: NEG (09/22 0215) Rubella: Immune (02/24 0000) RPR: Nonreactive (02/24 0000)  HBsAg: Negative (02/24 0000)  HIV: Non-reactive (02/24  0000)  GBS: Negative/-- (08/21 0000)   Cat 1 tracing, TOCO q3-16min  Assessment/Plan: This is a 28yo G3P1011 @ 40 6/7 arriving in labor, GBS neg. Now s/p epidural and progressing spontaneously. Membranes still intact, holding AROm at this time since I have an urgent PLTCS. Pelvis proven to 7lb0.1oz. Anticipate SVD shortly after AROM   Yesenia Macias 11/17/2023, 6:39 AM

## 2023-11-17 NOTE — Anesthesia Procedure Notes (Signed)
 Epidural Patient location during procedure: OB Start time: 11/17/2023 3:58 AM End time: 11/17/2023 4:08 AM  Staffing Anesthesiologist: Patrisha Bernardino SQUIBB, MD Performed: anesthesiologist   Preanesthetic Checklist Completed: patient identified, IV checked, site marked, risks and benefits discussed, monitors and equipment checked, pre-op evaluation and timeout performed  Epidural Patient position: sitting Prep: DuraPrep Patient monitoring: heart rate, cardiac monitor, continuous pulse ox and blood pressure Approach: midline Location: L4-L5 Injection technique: LOR air  Needle:  Needle type: Tuohy  Needle gauge: 17 G Needle length: 9 cm Needle insertion depth: 6 cm Catheter type: closed end flexible Catheter size: 19 Gauge Catheter at skin depth: 11 cm Test dose: negative and 1.5% lidocaine  with Epi 1:200 K  Assessment Events: blood not aspirated, no cerebrospinal fluid, injection not painful, no injection resistance and negative IV test  Additional Notes Informed consent obtained prior to proceeding including risk of failure, 1% risk of PDPH, risk of minor discomfort and bruising.  Discussed alternatives to epidural analgesia and patient desires to proceed.  Timeout performed pre-procedure verifying patient name, procedure, and platelet count.  Patient tolerated procedure well. Reason for block:procedure for pain

## 2023-11-17 NOTE — Anesthesia Preprocedure Evaluation (Signed)
 Anesthesia Evaluation  Patient identified by MRN, date of birth, ID band Patient awake    Reviewed: Allergy & Precautions, H&P , NPO status , Patient's Chart, lab work & pertinent test results  History of Anesthesia Complications Negative for: history of anesthetic complications  Airway Mallampati: II       Dental no notable dental hx.    Pulmonary former smoker   Pulmonary exam normal        Cardiovascular negative cardio ROS Normal cardiovascular exam     Neuro/Psych  PSYCHIATRIC DISORDERS Anxiety     negative neurological ROS     GI/Hepatic negative GI ROS, Neg liver ROS,,,  Endo/Other  negative endocrine ROS    Renal/GU negative Renal ROS     Musculoskeletal   Abdominal  (+) + obese  Peds  Hematology negative hematology ROS (+)   Anesthesia Other Findings   Reproductive/Obstetrics (+) Pregnancy                              Anesthesia Physical Anesthesia Plan  ASA: 2  Anesthesia Plan: Epidural   Post-op Pain Management:    Induction:   PONV Risk Score and Plan:   Airway Management Planned:   Additional Equipment:   Intra-op Plan:   Post-operative Plan:   Informed Consent: I have reviewed the patients History and Physical, chart, labs and discussed the procedure including the risks, benefits and alternatives for the proposed anesthesia with the patient or authorized representative who has indicated his/her understanding and acceptance.       Plan Discussed with:   Anesthesia Plan Comments:         Anesthesia Quick Evaluation

## 2023-11-17 NOTE — Plan of Care (Signed)
  Problem: Education: Goal: Knowledge of General Education information will improve Description: Including pain rating scale, medication(s)/side effects and non-pharmacologic comfort measures 11/17/2023 0308 by Verona Jonetta PARAS, RN Outcome: Progressing 11/17/2023 0306 by Verona Jonetta PARAS, RN Outcome: Progressing   Problem: Health Behavior/Discharge Planning: Goal: Ability to manage health-related needs will improve 11/17/2023 0308 by Verona Jonetta PARAS, RN Outcome: Progressing 11/17/2023 0306 by Verona Jonetta PARAS, RN Outcome: Progressing   Problem: Clinical Measurements: Goal: Ability to maintain clinical measurements within normal limits will improve 11/17/2023 0308 by Verona Jonetta PARAS, RN Outcome: Progressing 11/17/2023 0306 by Verona Jonetta PARAS, RN Outcome: Progressing Goal: Will remain free from infection 11/17/2023 0308 by Verona Jonetta PARAS, RN Outcome: Progressing 11/17/2023 0306 by Verona Jonetta PARAS, RN Outcome: Progressing Goal: Diagnostic test results will improve 11/17/2023 0308 by Verona Jonetta PARAS, RN Outcome: Progressing 11/17/2023 0306 by Verona Jonetta PARAS, RN Outcome: Progressing Goal: Respiratory complications will improve 11/17/2023 0308 by Verona Jonetta PARAS, RN Outcome: Progressing 11/17/2023 0306 by Verona Jonetta PARAS, RN Outcome: Progressing Goal: Cardiovascular complication will be avoided 11/17/2023 0308 by Verona Jonetta PARAS, RN Outcome: Progressing 11/17/2023 0306 by Verona Jonetta PARAS, RN Outcome: Progressing   Problem: Activity: Goal: Risk for activity intolerance will decrease 11/17/2023 0308 by Verona Jonetta PARAS, RN Outcome: Progressing 11/17/2023 0306 by Verona Jonetta PARAS, RN Outcome: Progressing   Problem: Nutrition: Goal: Adequate nutrition will be maintained 11/17/2023 0308 by Verona Jonetta PARAS, RN Outcome: Progressing 11/17/2023 0306 by Verona Jonetta PARAS, RN Outcome: Progressing   Problem: Coping: Goal: Level of anxiety will decrease 11/17/2023 0308 by Verona Jonetta PARAS, RN Outcome: Progressing 11/17/2023 0306 by Verona Jonetta PARAS, RN Outcome: Progressing   Problem: Elimination: Goal: Will not experience complications related to bowel motility 11/17/2023 0308 by Verona Jonetta PARAS, RN Outcome: Progressing 11/17/2023 0306 by Verona Jonetta PARAS, RN Outcome: Progressing Goal: Will not experience complications related to urinary retention 11/17/2023 0308 by Verona Jonetta PARAS, RN Outcome: Progressing 11/17/2023 0306 by Verona Jonetta PARAS, RN Outcome: Progressing   Problem: Pain Managment: Goal: General experience of comfort will improve and/or be controlled 11/17/2023 0308 by Verona Jonetta PARAS, RN Outcome: Progressing 11/17/2023 0306 by Verona Jonetta PARAS, RN Outcome: Progressing   Problem: Safety: Goal: Ability to remain free from injury will improve 11/17/2023 0308 by Verona Jonetta PARAS, RN Outcome: Progressing 11/17/2023 0306 by Verona Jonetta PARAS, RN Outcome: Progressing   Problem: Skin Integrity: Goal: Risk for impaired skin integrity will decrease 11/17/2023 0308 by Verona Jonetta PARAS, RN Outcome: Progressing 11/17/2023 0306 by Verona Jonetta PARAS, RN Outcome: Progressing

## 2023-11-17 NOTE — Plan of Care (Signed)

## 2023-11-17 NOTE — Progress Notes (Signed)
 Patient ID: Yesenia Macias, female   DOB: [birth year], 28 y.o.   MRN: [id number] SVE at noon noted to be anterior non reducible lip  Pt repositioned  to left recumbent position and rechecked later with no change noted  Now in flying cowgirl  Cat 1 strip , 135 TOCO - ctxs q 1-2 mins   Will recheck in in the next hour and start pushing if complete.  Anticipate svd

## 2023-11-17 NOTE — MAU Note (Signed)
 MAU Labor Triage Note:  .Yesenia Macias is a 28 y.o. at [redacted]w[redacted]d here in MAU reporting:  Contractions every: 3 minutes Onset of ctx: 2315 on 9/21 Pain Score: 6  Pain Location: Abdomen  ROM: denies Vaginal Bleeding: patient report consistent with bloody show Last SVE: 2 cm on Wednesday Labor Pain Management Plan: Planning epidural  GBS: Negative  Fetal Movement: Reports positive FM FHT: Fetal Heart Rate Mode: Doppler Baseline Rate (A): 147 bpm Multiple birth?: No  Vitals:   11/17/23 0139  BP: 139/85  Pulse: 64  Resp: 18  Temp: 98.7 F (37.1 C)  SpO2: 100%      Lab orders placed from triage: MAU Labor Eval OB Office: [facility]

## 2023-11-17 NOTE — Progress Notes (Signed)
 Yesenia Macias is a 28 y.o. G3P1011 at [redacted]w[redacted]d   Subjective: Pt comfortable with no complaints.   Objective: BP 112/69   Pulse (!) 58   Temp 97.8 F (36.6 C) (Oral)   Resp 18   Ht 5' 1 (1.549 m)   Wt 87.3 kg   SpO2 100%   BMI 36.35 kg/m  No intake/output data recorded. No intake/output data recorded.  FHT:  FHR: 125 bpm, variability: moderate,  accelerations:  Present,  decelerations:  Absent UC:   regular, every 1-3 minutes SVE:   Dilation: 9 Effacement (%): 90 Station: -1 Exam by:: Dr.Shelene Krage  Labs: Lab Results  Component Value Date   WBC 13.6 (H) 11/17/2023   HGB 13.8 11/17/2023   HCT 39.7 11/17/2023   MCV 88.0 11/17/2023   PLT 175 11/17/2023    Assessment / Plan: Spontaneous labor, progressing normally - AROM with light meconium noted. Now 9cm dil Labor: Progressing normally Preeclampsia:  n/a Fetal Wellbeing:  Category I Pain Control:  Epidural I/D:  n/a Anticipated MOD:  NSVD  Ted LELON Solo, DO 11/17/2023, 7:49 AM

## 2023-11-18 ENCOUNTER — Inpatient Hospital Stay (HOSPITAL_COMMUNITY): Admission: AD | Admit: 2023-11-18 | Source: Home / Self Care | Admitting: Obstetrics and Gynecology

## 2023-11-18 ENCOUNTER — Inpatient Hospital Stay (HOSPITAL_COMMUNITY)

## 2023-11-18 LAB — CBC
HCT: 37.1 % (ref 36.0–46.0)
Hemoglobin: 12.7 g/dL (ref 12.0–15.0)
MCH: 30.8 pg (ref 26.0–34.0)
MCHC: 34.2 g/dL (ref 30.0–36.0)
MCV: 89.8 fL (ref 80.0–100.0)
Platelets: 155 K/uL (ref 150–400)
RBC: 4.13 MIL/uL (ref 3.87–5.11)
RDW: 13.5 % (ref 11.5–15.5)
WBC: 15.8 K/uL — ABNORMAL HIGH (ref 4.0–10.5)
nRBC: 0 % (ref 0.0–0.2)

## 2023-11-18 MED ORDER — ACETAMINOPHEN 325 MG PO TABS: 650.0000 mg | ORAL_TABLET | Freq: Four times a day (QID) | ORAL | Status: AC | PRN

## 2023-11-18 MED ORDER — IBUPROFEN 200 MG PO TABS: 600.0000 mg | ORAL_TABLET | Freq: Four times a day (QID) | ORAL | Status: AC | PRN

## 2023-11-18 NOTE — Anesthesia Postprocedure Evaluation (Signed)
 Anesthesia Post Note  Patient: Yesenia Macias  Procedure(s) Performed: AN AD HOC LABOR EPIDURAL     Patient location during evaluation: Mother Baby Anesthesia Type: Epidural Level of consciousness: awake, oriented and awake and alert Pain management: pain level controlled Vital Signs Assessment: post-procedure vital signs reviewed and stable Respiratory status: spontaneous breathing and nonlabored ventilation Cardiovascular status: stable Postop Assessment: no headache, patient able to bend at knees, adequate PO intake, able to ambulate and no apparent nausea or vomiting Anesthetic complications: no   No notable events documented.  Last Vitals:  Vitals:   11/18/23 0101 11/18/23 0549  BP: 127/85 120/82  Pulse: 69 69  Resp: 18 18  Temp: 36.7 C 36.8 C  SpO2: 99% 99%    Last Pain:  Vitals:   11/18/23 0811  TempSrc:   PainSc: 0-No pain   Pain Goal:                Epidural/Spinal Function Cutaneous sensation: Normal sensation (11/18/23 0811), Patient able to flex knees: Yes (11/18/23 0811), Patient able to lift hips off bed: Yes (11/18/23 0811), Back pain beyond tenderness at insertion site: No (11/18/23 0811), Progressively worsening motor and/or sensory loss: No (11/18/23 0811), Bowel and/or bladder incontinence post epidural: No (11/18/23 0811)  Airam Heidecker

## 2023-11-18 NOTE — Progress Notes (Signed)
 Post Partum Day 1 Subjective: Patient is doing well this morning. Pain is controlled. Ambulating, voiding, tolerating PO. Minimal lochia. Breastfeeding.  Mood is good  Objective: Patient Vitals for the past 24 hrs:  BP Temp Temp src Pulse Resp SpO2  11/18/23 0549 120/82 98.3 F (36.8 C) Oral 69 18 99 %  11/18/23 0101 127/85 98.1 F (36.7 C) Oral 69 18 99 %  11/17/23 2100 136/73 98.3 F (36.8 C) Oral 74 18 99 %  11/17/23 1700 127/72 98.2 F (36.8 C) Oral 63 18 98 %  11/17/23 1600 119/69 98.4 F (36.9 C) Oral 72 17 100 %  11/17/23 1550 123/69 -- -- 76 -- --  11/17/23 1530 (!) 115/56 -- -- 93 -- --  11/17/23 1515 130/73 -- -- (!) 220 -- --  11/17/23 1503 129/66 -- -- 73 -- --  11/17/23 1450 (!) 122/45 -- -- 77 -- --  11/17/23 1436 -- 97.8 F (36.6 C) Oral -- -- --  11/17/23 1430 (!) 114/59 -- -- (!) 101 -- --  11/17/23 1330 118/79 -- -- 81 -- --  11/17/23 1230 113/81 -- -- 80 -- --  11/17/23 1215 111/62 -- -- 66 -- --    Physical Exam:  General: alert, cooperative, and no distress Lochia: appropriate Uterine Fundus: firm DVT Evaluation: No evidence of DVT seen on physical exam.  Recent Labs    11/17/23 0216 11/18/23 0448  WBC 13.6* 15.8*  HGB 13.8 12.7  HCT 39.7 37.1  PLT 175 155    No results for input(s): NA, K, CL, CO2CT, BUN, CREATININE, GLUCOSE, BILITOT, ALT, AST, ALKPHOS, PROT, ALBUMIN in the last 72 hours.  No results for input(s): CALCIUM, MG, PHOS in the last 72 hours.  No results for input(s): PROTIME, APTT, INR in the last 72 hours.  No results for input(s): PROTIME, APTT, INR, FIBRINOGEN in the last 72 hours. Assessment/Plan: Yesenia Macias 28 y.o. [id number] PPD#1 sp SVD 1. PPC: routine PP care 2. Rh pos 3. 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  4. Dispo: potential d/c home later today vs. Tomorrow    LOS: 1 day   Yesenia Macias Chapel 11/18/2023, 12:14 PM

## 2023-11-18 NOTE — Discharge Summary (Signed)
 Postpartum Discharge Summary  Date of Service updated***     Patient Name: Yesenia Macias DOB: 04/06/1995 MRN: [id number]  Date of admission: 11/17/2023 Delivery date:11/17/2023 Delivering provider: DELANA BASE [facility] Date of discharge: 11/18/2023  Admitting diagnosis: Pregnant and not yet delivered in third trimester [Z34.93] Intrauterine pregnancy: [redacted]w[redacted]d     Secondary diagnosis:  Principal Problem:   Pregnant and not yet delivered in third trimester  Additional problems: ***    Discharge diagnosis: {DX.:23714}                                              Post partum procedures:{Postpartum procedures:23558} Augmentation: {Augmentation:20782} Complications: {OB Labor/Delivery Complications:20784}  Hospital course: {Courses:23701}  Magnesium Sulfate received: {Mag received:30440022} BMZ received: {BMZ received:30440023} Rhophylac:{Rhophylac received:30440032} FFM:{FFM:69559966} T-DaP:{Tdap:23962} Flu: {Qol:76036} RSV Vaccine received: {RSV:31013} Transfusion:{Transfusion received:30440034} Immunizations administered: Immunization History  Administered Date(s) Administered   DTaP 07/04/1993, 09/06/1993, 11/05/1993, 12/03/1994, 10/03/1997   HIB (PRP-OMP) 07/04/1993, 09/06/1993, 11/05/1993, 12/03/1994   Hepatitis B February 18, 1994, 07/04/1993, 02/08/1994   IPV 07/04/1993, 09/06/1993, 11/05/1993, 10/03/1997   MMR 12/03/1994, 10/03/1997   Varicella 12/03/1994    Physical exam  Vitals:   11/17/23 2100 11/18/23 0101 11/18/23 0549 11/18/23 1500  BP: 136/73 127/85 120/82 122/75  Pulse: 74 69 69 73  Resp: 18 18 18 18   Temp: 98.3 F (36.8 C) 98.1 F (36.7 C) 98.3 F (36.8 C) 98.7 F (37.1 C)  TempSrc: Oral Oral Oral Oral  SpO2: 99% 99% 99%   Weight:      Height:       General: {Exam; general:21111117} Lochia: {Desc; appropriate/inappropriate:30686::appropriate} Uterine Fundus: {Desc; firm/soft:30687} Incision: {Exam; incision:21111123} DVT Evaluation: {Exam;  dvt:2111122} Labs: Lab Results  Component Value Date   WBC 15.8 (H) 11/18/2023   HGB 12.7 11/18/2023   HCT 37.1 11/18/2023   MCV 89.8 11/18/2023   PLT 155 11/18/2023       No data to display         Edinburgh Score:    11/18/2023    7:56 AM  Edinburgh Postnatal Depression Scale Screening Tool  I have been able to laugh and see the funny side of things. 0  I have looked forward with enjoyment to things. 0  I have blamed myself unnecessarily when things went wrong. 1  I have been anxious or worried for no good reason. 2  I have felt scared or panicky for no good reason. 2  Things have been getting on top of me. 0  I have been so unhappy that I have had difficulty sleeping. 0  I have felt sad or miserable. 0  I have been so unhappy that I have been crying. 0  The thought of harming myself has occurred to me. 0  Edinburgh Postnatal Depression Scale Total 5      After visit meds:  Allergies as of 11/18/2023   No Known Allergies      Medication List     TAKE these medications    acetaminophen  325 MG tablet Commonly known as: Tylenol  Take 2 tablets (650 mg total) by mouth every 6 (six) hours as needed for mild pain (pain score 1-3) or moderate pain (pain score 4-6).   ibuprofen  200 MG tablet Commonly known as: ADVIL  Take 3 tablets (600 mg total) by mouth every 6 (six) hours as needed for moderate pain (pain score 4-6) or cramping.  polyethylene glycol 17 g packet Commonly known as: MIRALAX / GLYCOLAX Take 17 g by mouth daily as needed for mild constipation.   PRENATAL PO Take 1 tablet by mouth daily.   PROBIOTIC PO Take 1 tablet by mouth daily.         Discharge home in stable condition Infant Feeding: {Baby feeding:23562} Infant Disposition:{CHL IP OB HOME WITH FNUYZM:76418} Discharge instruction: per After Visit Summary and Postpartum booklet. Activity: Advance as tolerated. Pelvic rest for 6 weeks.  Diet: {OB diet:21111121} Anticipated Birth  Control: {Birth Control:23956} Postpartum Appointment:{Outpatient follow up:23559} Additional Postpartum F/U: {PP Procedure:23957} Future Appointments:No future appointments. Follow up Visit:  Follow-up Information     Ob/Gyn, [facility] St[facility]. Schedule an appointment as soon as possible for a visit in 4 week(s).   Contact information: [address] [place] [address] [phone]                     11/18/2023 Rosaline FORBES Chapel, MD

## 2023-11-18 NOTE — Lactation Note (Signed)
 This note was copied from a baby's chart. Lactation Consultation Note  Patient Name: Yesenia Macias Date: 11/18/2023 Age:30 hours, P2  Reason for consult: Follow-up assessment;Term;Infant weight loss;Breastfeeding assistance;Difficult latch (8 % weight loss,) Baby ready feed and LC offered to assist to latch, mom receptive.  LC 1st showed mom how to due the reverse pressure due to areola edema and then attempted to latch, The latch was on and off and baby was unable to sustain the latch.  LC used the #20 NS mom brought from home and instilled formula into the top. Baby latched on the left breast easily and fed for 20 mins , paused 1-2 times and LC instilled formula under the NS.  LC reassured mom the baby made a great effort to latch and feed from the NS. The nipple was well rounded when the baby released.  LC plan:  Breast shells between feedings except when sleeping ( as shown )  Steps for latching - hand express, pre-pump with the Hand pump and reverse pressure.  Instill EBM or formula into the top of the NS .  Use the football position until the baby is latching well consistently.  Feed for 15 -20 mins and supplement with a very flow purple nipple increasing the volumes up to 30 ml due to the 8 % weight loss.  Post pump both for 15-20 mins and save the milk for the next feeding.  Switch to the other breast next feeding.  LC set up the DEBP , mom also has a hand pump, and shells.   LC assessed the baby's oral cavity with gloved fingers due to moms mentioning her 1st baby had a tongue and lip tie,.  Oral assessment findings : upper lip stretches with exam and the skin notch under the upper lip is slightly above the gum line.  Baby raises her tongue upward , but not past the gumlines , Noted the anterior portion of the tongue to cup upward when crying.  No restriction noted to the tip of the tongue. High palate noted, which due to the areola edema, if preventing the tissue from  creating it.   Maternal Data Has patient been taught Hand Expression?: Yes (easily hand express) Does the patient have breastfeeding experience prior to this delivery?: Yes How long did the patient breastfeed?: per mom challenges with 1st , latching x 1 month , tongue released and EBM in a bottle  for several  months  Feeding Mother's Current Feeding Choice: Breast Milk and Formula Nipple Type: Extra Slow Flow  LATCH Score Latch: Repeated attempts needed to sustain latch, nipple held in mouth throughout feeding, stimulation needed to elicit sucking reflex.  Audible Swallowing: Spontaneous and intermittent  Type of Nipple: Everted at rest and after stimulation (areola edema , after pre - pumping with the hand pump and reverse pressure the NS fit well)  Comfort (Breast/Nipple): Soft / non-tender  Hold (Positioning): Assistance needed to correctly position infant at breast and maintain latch.  LATCH Score: 8   Lactation Tools Discussed/Used Tools: Shells;Pump;Flanges;Nipple Shields Nipple shield size: 20;Other (comment) (mom brought it from home) Flange Size: 21;18 Breast pump type: Manual;Double-Electric Breast Pump Pump Education: Setup, frequency, and cleaning;Milk Storage Reason for Pumping: due to use of the Nipple Shield, and 8 % weight loss  Interventions Interventions: Breast feeding basics reviewed;Assisted with latch;Skin to skin;Breast massage;Hand express;Pre-pump if needed;Reverse pressure;Breast compression;Adjust position;Support pillows;Position options;Coconut oil;Shells;Hand pump;DEBP;Education;LC Services brochure;CDC milk storage guidelines;CDC Guidelines for Breast Pump Cleaning  Discharge  Pump: DEBP;Manual;Hands Free;Personal WIC Program: No  Consult Status Consult Status: Follow-up Date: 11/19/23 Follow-up type: In-patient    Yesenia Macias Yesenia Macias 11/18/2023, 2:15 PM

## 2023-11-18 NOTE — Lactation Note (Signed)
 This note was copied from a baby's chart. Lactation Consultation Note  Patient Name: Yesenia Macias Date: 11/18/2023 Age:30 hours, P2 experienced , 8 % weight loss. OA incompatibility + DAT . Jaundice level 0.9 at 19 hours.  Reason for consult: Follow-up assessment;Term Per mom recently fed the baby 30 ml. LC noted the baby to be sound asleep.  Per mom the baby won't open her mouth to latch deeply.  LC reviewed and updated the doc flow sheets per mom.  LC recommended to call for the next feeding for a complete assessment.  Parents also would like to go home today.  Maternal Data Has patient been taught Hand Expression?: Yes Does the patient have breastfeeding experience prior to this delivery?: Yes How long did the patient breastfeed?: per mom challenges with 1st , latching x 1 month , tongue released and EBM in a bottle  for several  months  Feeding Mother's Current Feeding Choice: Breast Milk and Formula  LATCH Score 6-7    Lactation Tools Discussed/Used Tools: Pump (nurse provided) Breast pump type: Manual Pump Education: Setup, frequency, and cleaning;Milk Storage  Interventions Interventions: Breast feeding basics reviewed;Reverse pressure;Hand pump;Education;CDC milk storage guidelines;CDC Guidelines for Breast Pump Cleaning;LC Services brochure  Discharge Pump: DEBP;Manual;Hands Free;Personal  Consult Status Consult Status: Follow-up Date: 11/18/23 Follow-up type: In-patient    Yesenia Macias 11/18/2023, 10:33 AM

## 2023-11-18 NOTE — Social Work (Signed)
 CSW received consult for hx of Postpartum  Depression.  CSW met with MOB to offer support and complete assessment. CSW entered the room and observed MOB resting in bed holding the infant and FOB at bedside. CSW introduced self, CSW role and reason for visit, MOB was agreeable to visit and allowed FOB to remain in the room. CSW inquired about MOB MH hx, MOB reported she did experience PPD after the birth of her son for about 3-6 months. MOB reported feeling overwhelmed and hopeless. MOB reported she was prescribed Prozac at that time and it was helpful. CSW inquired about how MOB is being proactive about her MH this time, MOB reported she is open to medication if need be but wants to gauge her mood first. MOB reported she is aware of signs and symptoms to look for. CSW provided education regarding the baby blues period vs. perinatal mood disorders, discussed treatment and gave resources for mental health follow up if concerns arise.  CSW recommends self-evaluation during the postpartum time period using the New Mom Checklist from Postpartum Progress and encouraged MOB to contact a medical professional if symptoms are noted at any time.  MOB identified FOB and family ash her support.  CSW provided review of Sudden Infant Death Syndrome (SIDS) precautions.  MOB identified [facility] for infants follow up care. MOB reported she has all essential items for the infant including a bassinet and car seat.  CSW identifies no further need for intervention and no barriers to discharge at this time.  Gordy Goar, LCSWA Clinical Social Worker [phone]

## 2023-11-27 ENCOUNTER — Telehealth (HOSPITAL_COMMUNITY): Payer: Self-pay | Admitting: *Deleted

## 2023-11-27 NOTE — Telephone Encounter (Signed)
 11/27/2023  Name: Yesenia Macias MRN: [id number] DOB: 02-26-1995  Reason for Call:  Transition of Care Hospital Discharge Call  Contact Status: Patient Contact Status: Complete  Language assistant needed: Interpreter Mode: Interpreter Not Needed        Follow-Up Questions: Do You Have Any Concerns About Your Health As You Heal From Delivery?: No Do You Have Any Concerns About Your Infants Health?: No  Edinburgh Postnatal Depression Scale:  In the Past 7 Days: I have been able to laugh and see the funny side of things.: As much as I always could I have looked forward with enjoyment to things.: As much as I ever did I have blamed myself unnecessarily when things went wrong.: Not very often I have been anxious or worried for no good reason.: Yes, sometimes I have felt scared or panicky for no good reason.: No, not much Things have been getting on top of me.: No, I have been coping as well as ever I have been so unhappy that I have had difficulty sleeping.: Not at all I have felt sad or miserable.: No, not at all I have been so unhappy that I have been crying.: No, never The thought of harming myself has occurred to me.: Never Edinburgh Postnatal Depression Scale Total: 4  PHQ2-9 Depression Scale:     Discharge Follow-up: Edinburgh score requires follow up?: No Patient was advised of the following resources:: Support Group, Breastfeeding Support Group (declines postpartum group information via email)  Post-discharge interventions: Reviewed Newborn Safe Sleep Practices  Mliss Sieve, RN 11/27/2023 14:45
